# Patient Record
Sex: Female | Born: 1995 | Race: White | Hispanic: No | Marital: Single | Smoking: Former smoker
Health system: Southern US, Community
[De-identification: ages and names within clinical notes are randomized; demographics above are authoritative.]

## PROBLEM LIST (undated history)

## (undated) DIAGNOSIS — G43909 Migraine, unspecified, not intractable, without status migrainosus: Secondary | ICD-10-CM

## (undated) HISTORY — DX: Migraine, unspecified, not intractable, without status migrainosus: G43.909

---
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
## 2019-03-05 ENCOUNTER — Other Ambulatory Visit: Payer: Self-pay

## 2019-03-05 ENCOUNTER — Emergency Department (HOSPITAL_COMMUNITY): Payer: Medicaid - Out of State

## 2019-03-05 ENCOUNTER — Emergency Department (HOSPITAL_COMMUNITY)
Admission: EM | Admit: 2019-03-05 | Discharge: 2019-03-05 | Disposition: A | Discharge status: Home / Self Care | Payer: Medicaid - Out of State | Attending: Emergency Medicine | Admitting: Emergency Medicine

## 2019-03-05 ENCOUNTER — Encounter (HOSPITAL_COMMUNITY): Payer: Self-pay

## 2019-03-05 DIAGNOSIS — F17200 Nicotine dependence, unspecified, uncomplicated: Secondary | ICD-10-CM | POA: Insufficient documentation

## 2019-03-05 DIAGNOSIS — Z79899 Other long term (current) drug therapy: Secondary | ICD-10-CM | POA: Diagnosis not present

## 2019-03-05 DIAGNOSIS — R112 Nausea with vomiting, unspecified: Secondary | ICD-10-CM | POA: Diagnosis present

## 2019-03-05 DIAGNOSIS — K529 Noninfective gastroenteritis and colitis, unspecified: Secondary | ICD-10-CM | POA: Diagnosis not present

## 2019-03-05 LAB — CBC WITH DIFFERENTIAL/PLATELET
Abs Immature Granulocytes: 0.06 10*3/uL (ref 0.00–0.07)
Basophils Absolute: 0 10*3/uL (ref 0.0–0.1)
Basophils Relative: 0 %
Eosinophils Absolute: 0 10*3/uL (ref 0.0–0.5)
Eosinophils Relative: 0 %
HCT: 40.7 % (ref 36.0–46.0)
Hemoglobin: 13.5 g/dL (ref 12.0–15.0)
Immature Granulocytes: 0 %
Lymphocytes Relative: 7 %
Lymphs Abs: 0.9 10*3/uL (ref 0.7–4.0)
MCH: 29.5 pg (ref 26.0–34.0)
MCHC: 33.2 g/dL (ref 30.0–36.0)
MCV: 88.9 fL (ref 80.0–100.0)
Monocytes Absolute: 1.1 10*3/uL — ABNORMAL HIGH (ref 0.1–1.0)
Monocytes Relative: 8 %
Neutro Abs: 11.5 10*3/uL — ABNORMAL HIGH (ref 1.7–7.7)
Neutrophils Relative %: 85 %
Platelets: 271 10*3/uL (ref 150–400)
RBC: 4.58 MIL/uL (ref 3.87–5.11)
RDW: 12.8 % (ref 11.5–15.5)
WBC: 13.6 10*3/uL — ABNORMAL HIGH (ref 4.0–10.5)
nRBC: 0 % (ref 0.0–0.2)

## 2019-03-05 LAB — COMPREHENSIVE METABOLIC PANEL
ALT: 12 U/L (ref 0–44)
AST: 18 U/L (ref 15–41)
Albumin: 3.9 g/dL (ref 3.5–5.0)
Alkaline Phosphatase: 51 U/L (ref 38–126)
Anion gap: 10 (ref 5–15)
BUN: 8 mg/dL (ref 6–20)
CO2: 22 mmol/L (ref 22–32)
Calcium: 8.4 mg/dL — ABNORMAL LOW (ref 8.9–10.3)
Chloride: 104 mmol/L (ref 98–111)
Creatinine, Ser: 0.7 mg/dL (ref 0.44–1.00)
GFR calc Af Amer: >60 mL/min (ref >60)
GFR calc non Af Amer: >60 mL/min (ref >60)
Glucose, Bld: 102 mg/dL — ABNORMAL HIGH (ref 70–99)
Potassium: 3.4 mmol/L — ABNORMAL LOW (ref 3.5–5.1)
Sodium: 136 mmol/L (ref 135–145)
Total Bilirubin: 0.6 mg/dL (ref 0.3–1.2)
Total Protein: 6.7 g/dL (ref 6.5–8.1)

## 2019-03-05 LAB — LIPASE, BLOOD: Lipase: 27 U/L (ref 11–51)

## 2019-03-05 LAB — C DIFFICILE QUICK SCREEN W PCR REFLEX
C Diff antigen: POSITIVE — AB
C Diff interpretation: DETECTED
C Diff toxin: POSITIVE — AB

## 2019-03-05 LAB — URINALYSIS, ROUTINE W REFLEX MICROSCOPIC
Bilirubin Urine: NEGATIVE
Glucose, UA: NEGATIVE mg/dL
Ketones, ur: NEGATIVE mg/dL
Leukocytes,Ua: NEGATIVE
Nitrite: NEGATIVE
Protein, ur: NEGATIVE mg/dL
Specific Gravity, Urine: 1.019 (ref 1.005–1.030)
pH: 5 (ref 5.0–8.0)

## 2019-03-05 LAB — PREGNANCY, URINE: Preg Test, Ur: NEGATIVE

## 2019-03-05 LAB — POC OCCULT BLOOD, ED: Fecal Occult Bld: NEGATIVE

## 2019-03-05 IMAGING — DX CHEST - 2 VIEW
2 series · 2 of 2 positions shown · non-contrast
Comparison: None.

CLINICAL DATA: Abdominal cramping and nausea.  Fever.

EXAM:
CHEST - 2 VIEW

[chest pa]
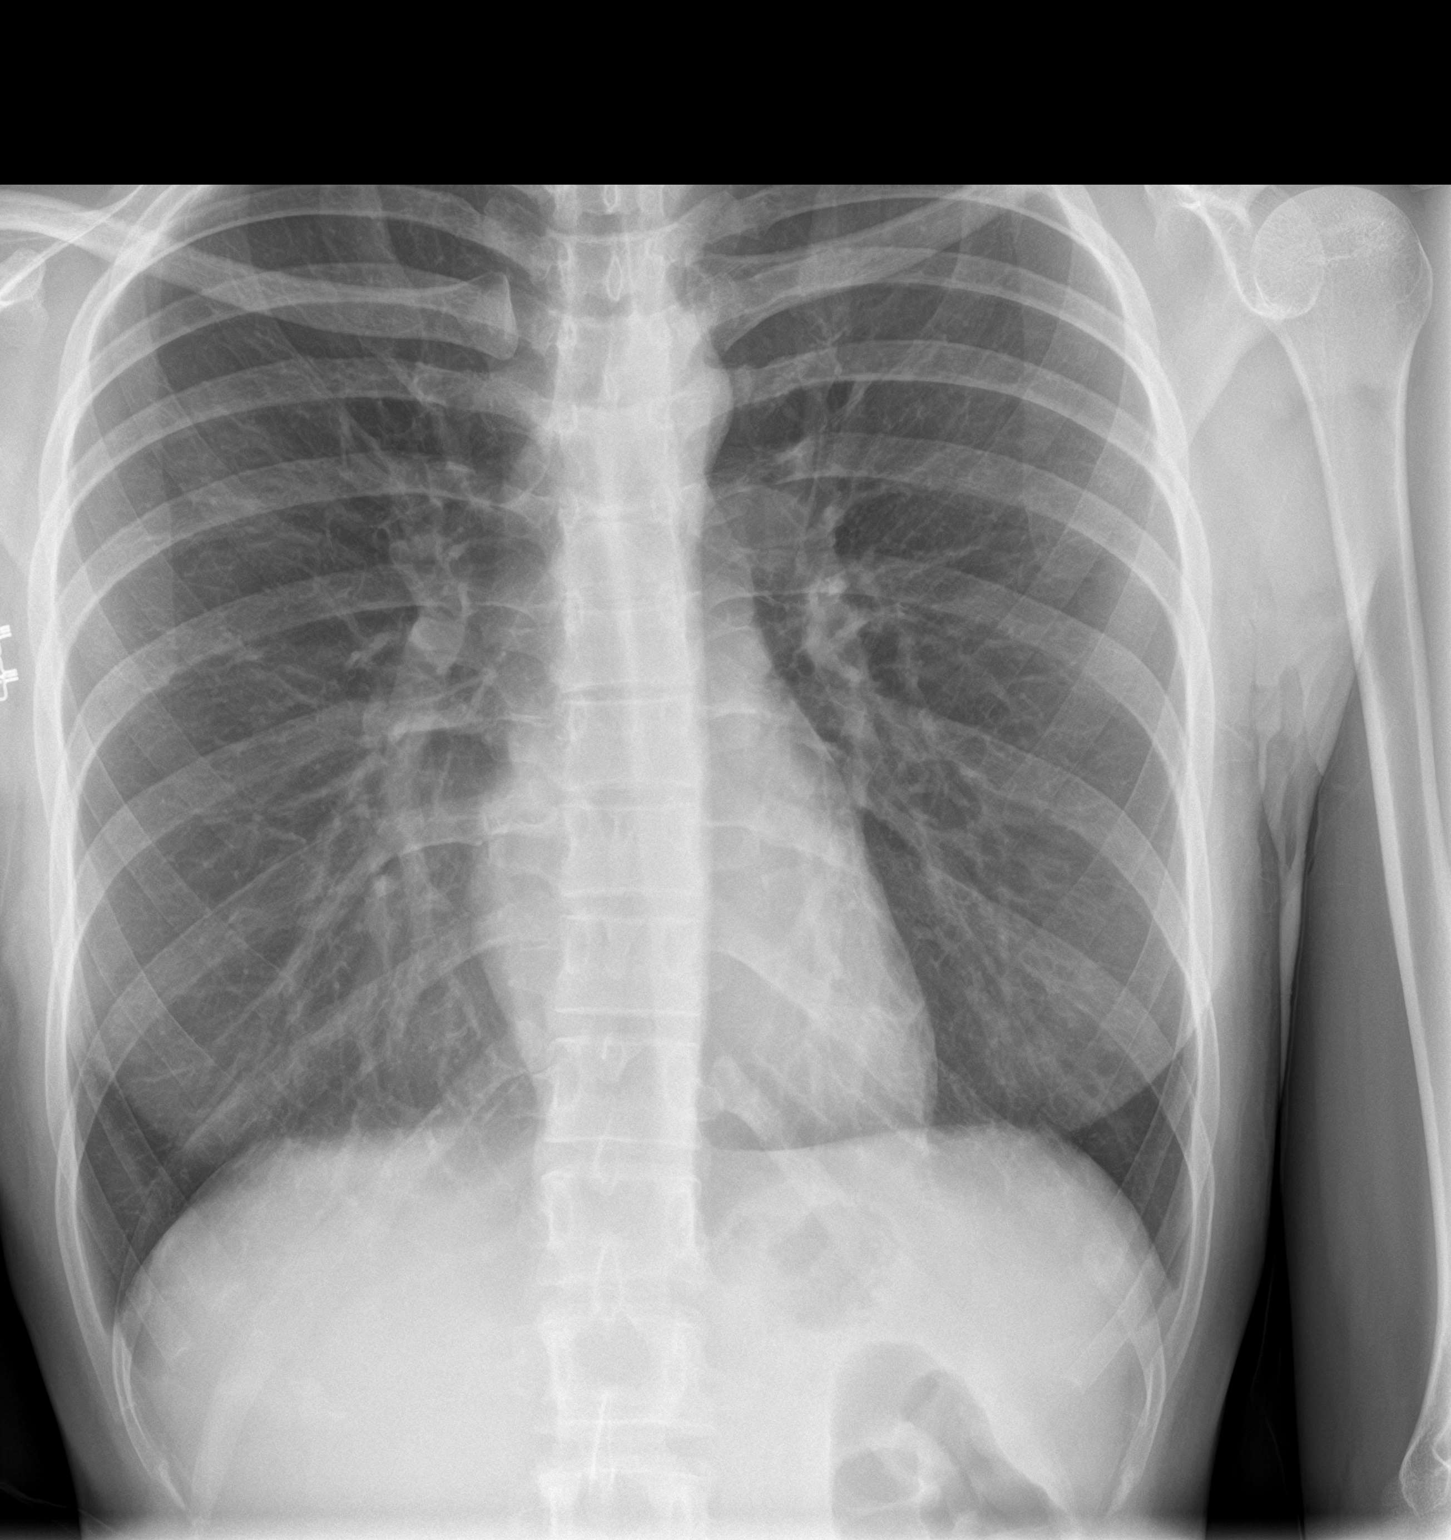

[chest lat]
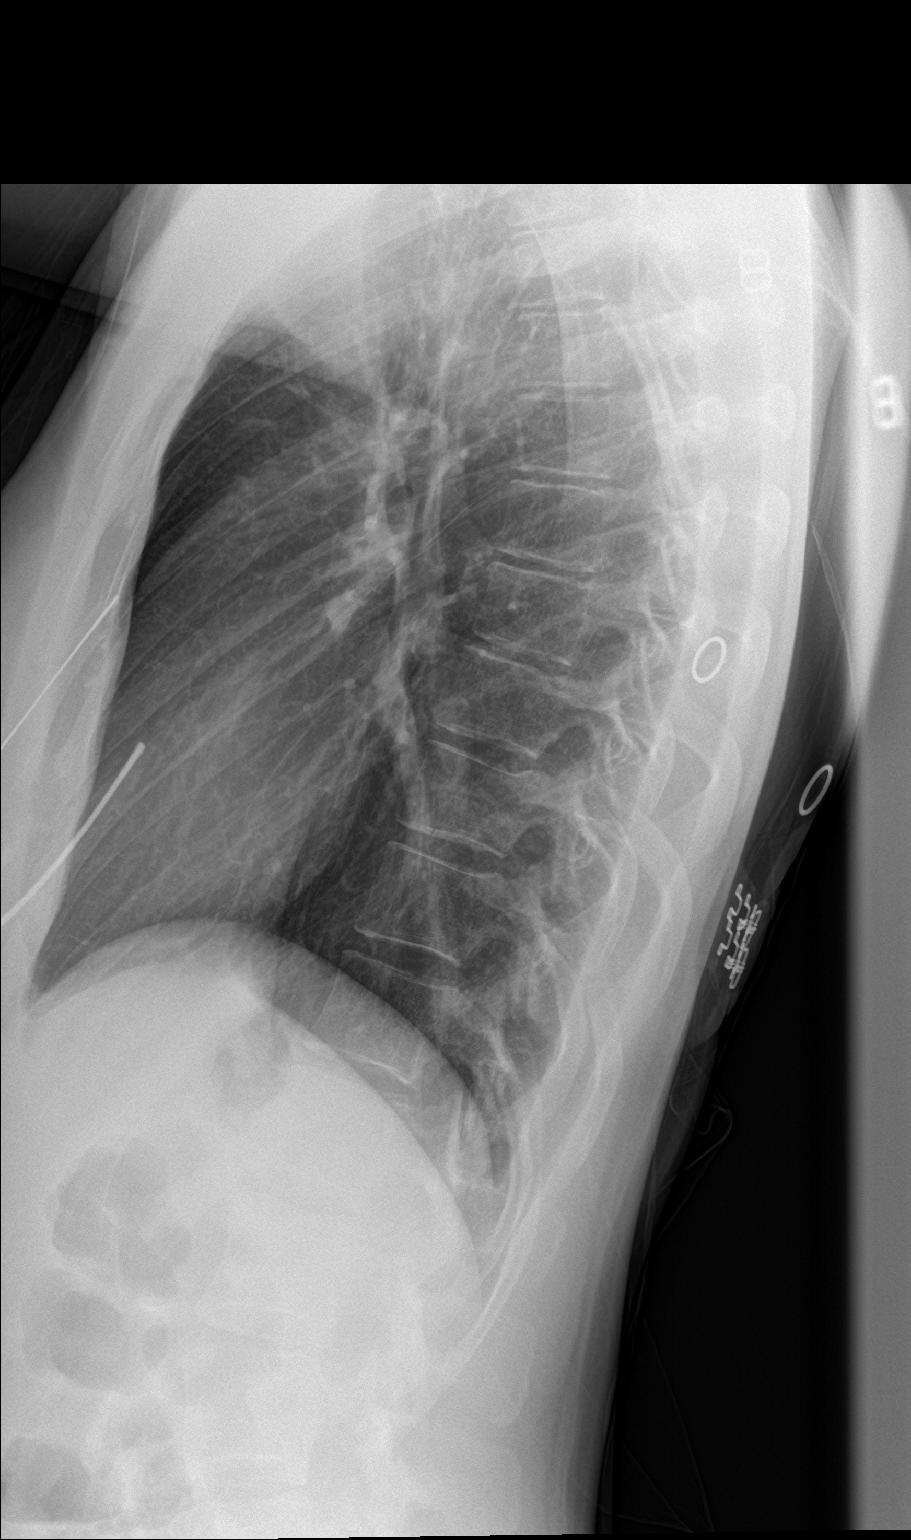

[2 of 2 positions shown; findings below may reference images not displayed]

FINDINGS: Lungs are clear. Heart size and pulmonary vascularity are normal. No
adenopathy. No bone lesions.
IMPRESSION: No edema or consolidation.

## 2019-03-05 IMAGING — CT CT ABDOMEN AND PELVIS WITH CONTRAST
2 of 4 series · 14 of 46 positions shown, 16 images · IV contrast (omnipaque)
Comparison: [DATE]

CLINICAL DATA: Abdominal pain with nausea, vomiting, and diarrhea

EXAM:
CT ABDOMEN AND PELVIS WITH CONTRAST
TECHNIQUE: Multidetector CT imaging of the abdomen and pelvis was performed
using the standard protocol following bolus administration of
intravenous contrast. Oral contrast was also administered.
CONTRAST:  30mL OMNIPAQUE IOHEXOL 300 MG/ML SOLN, 75mL OMNIPAQUE
IOHEXOL 300 MG/ML SOLN

[Series 2: axial st · axial · 0.59mm/px · z∈[+746,+1186]mm · 11 of 100 slices shown, 13 images]
[im 6/100  soft-tissue]
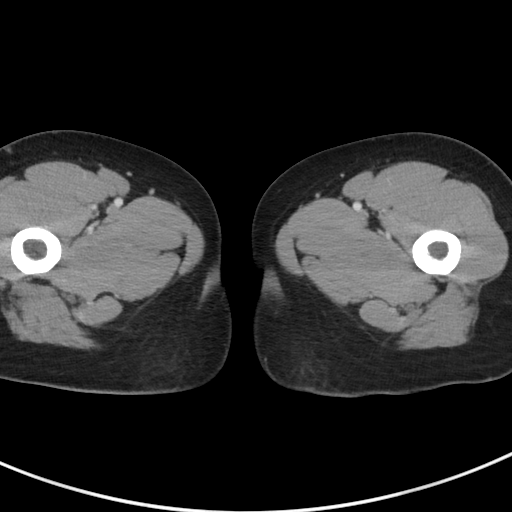
[im 6/100  bone]
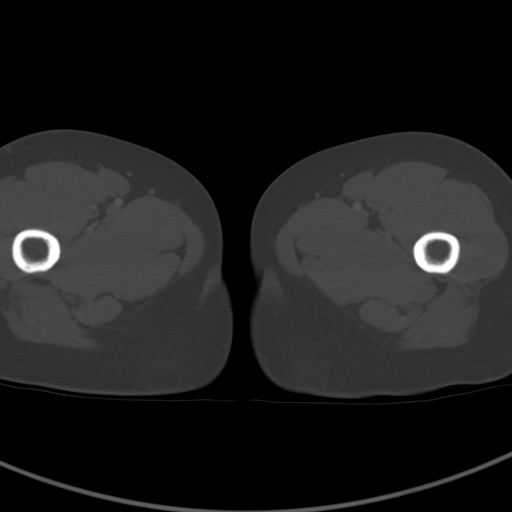
[im 17/100  soft-tissue]
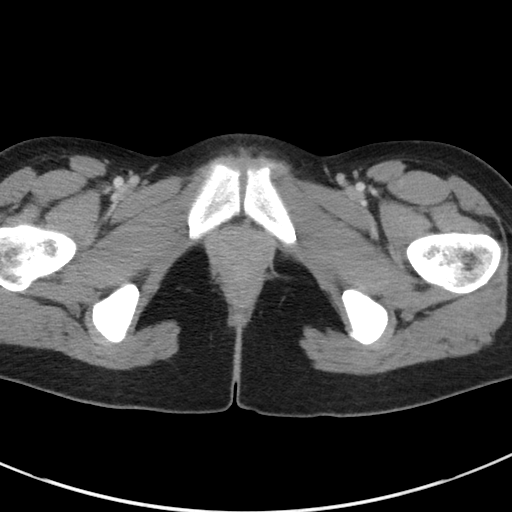
[im 23/100  soft-tissue]
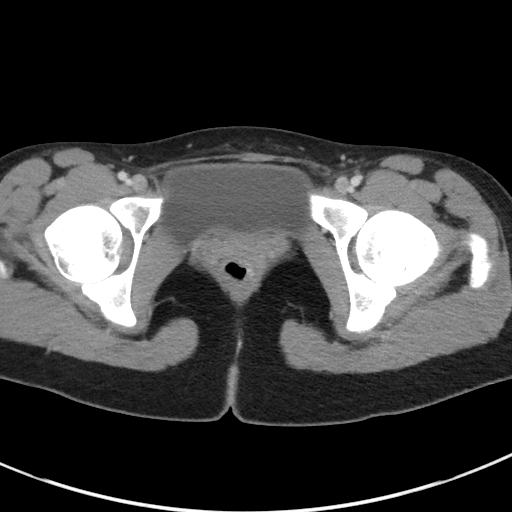
[im 34/100  soft-tissue]
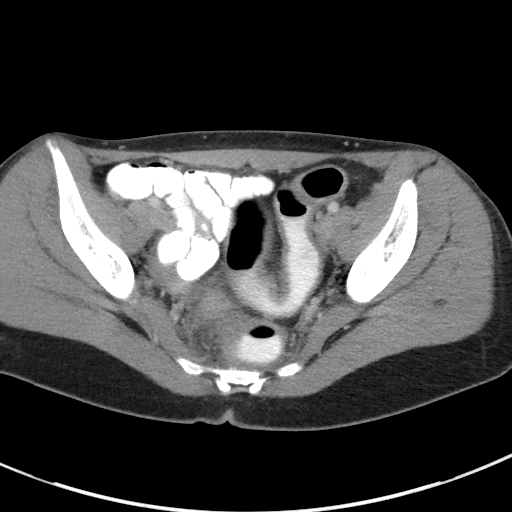
[im 39/100  soft-tissue]
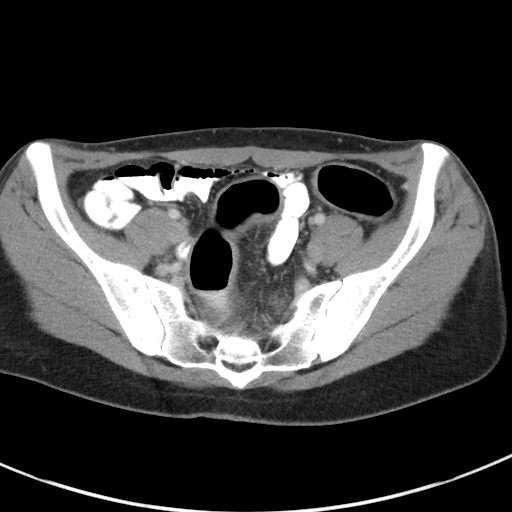
[im 50/100  soft-tissue]
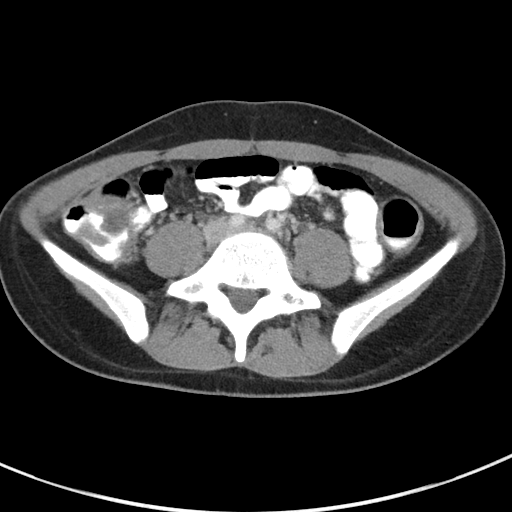
[im 61/100  soft-tissue]
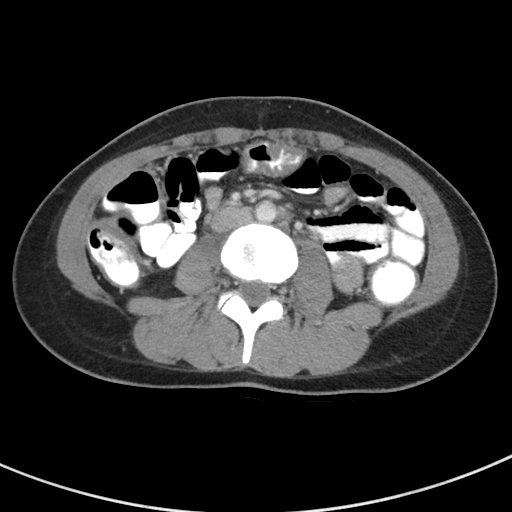
[im 67/100  soft-tissue]
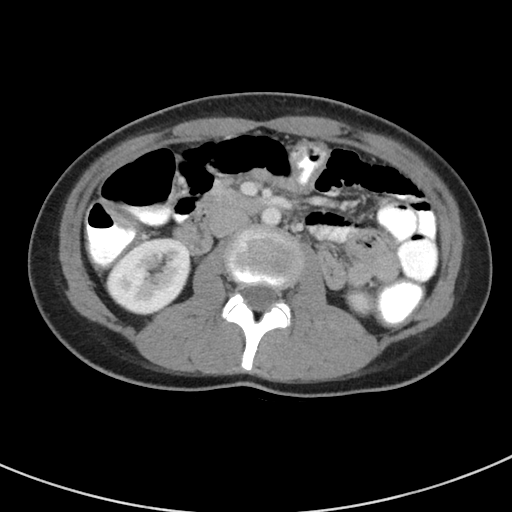
[im 78/100  soft-tissue]
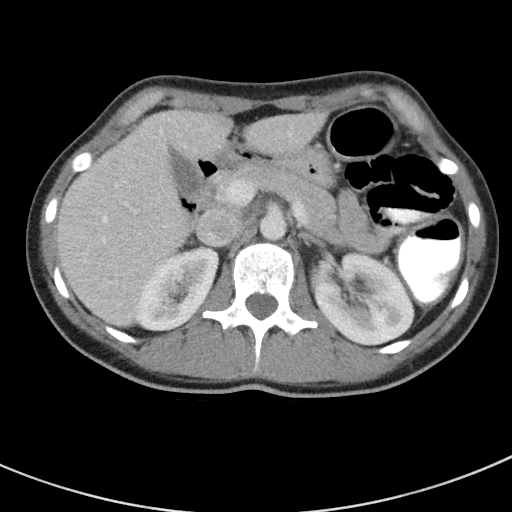
[im 78/100  bone]
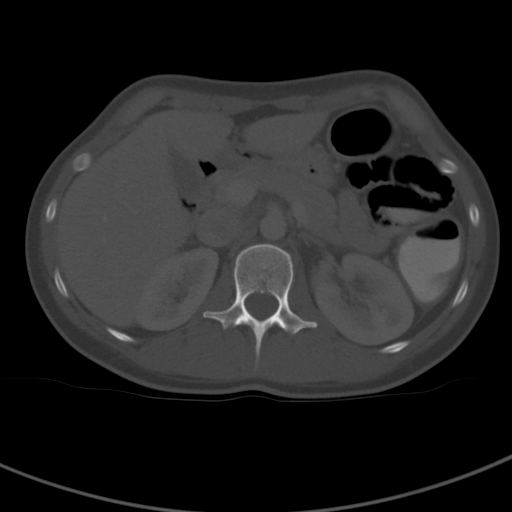
[im 83/100  soft-tissue]
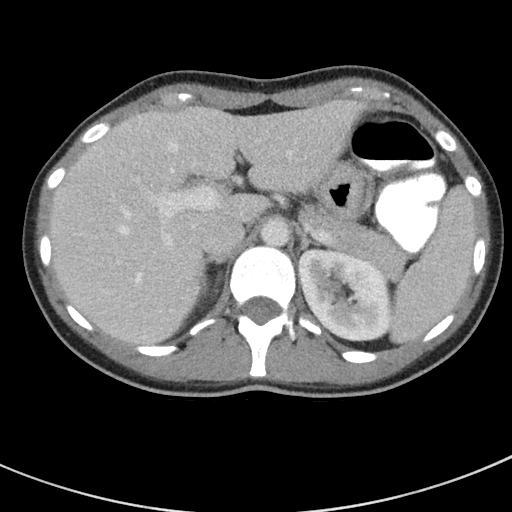
[im 94/100  soft-tissue]
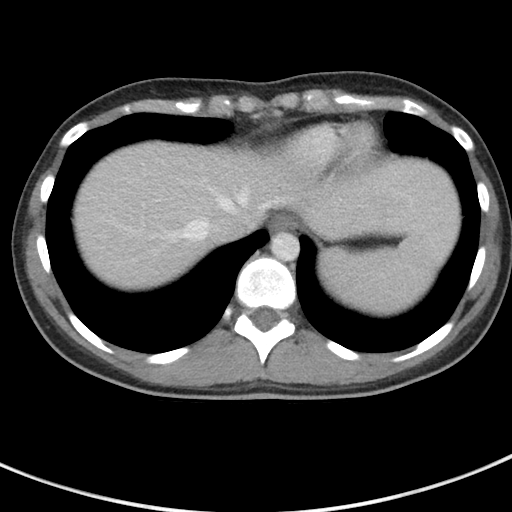

[Series 5: coronal st · coronal · 0.63mm/px · 3 of 61 slices shown]
[im 21/61  soft-tissue]
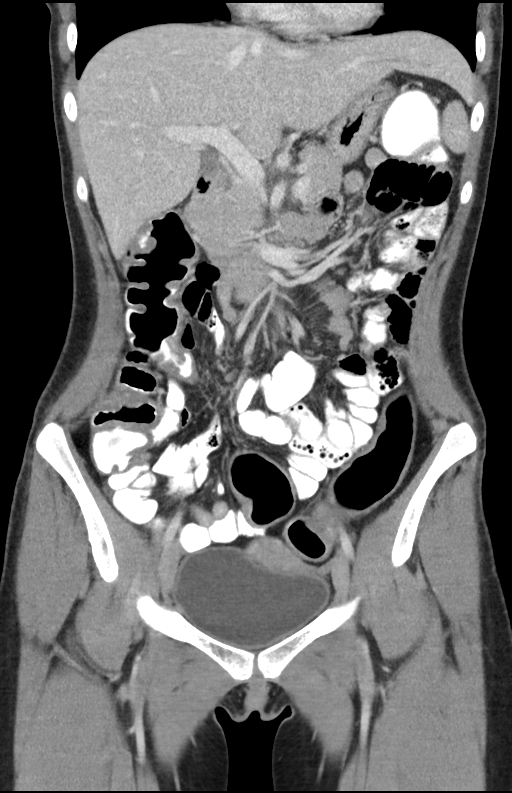
[im 27/61  soft-tissue]
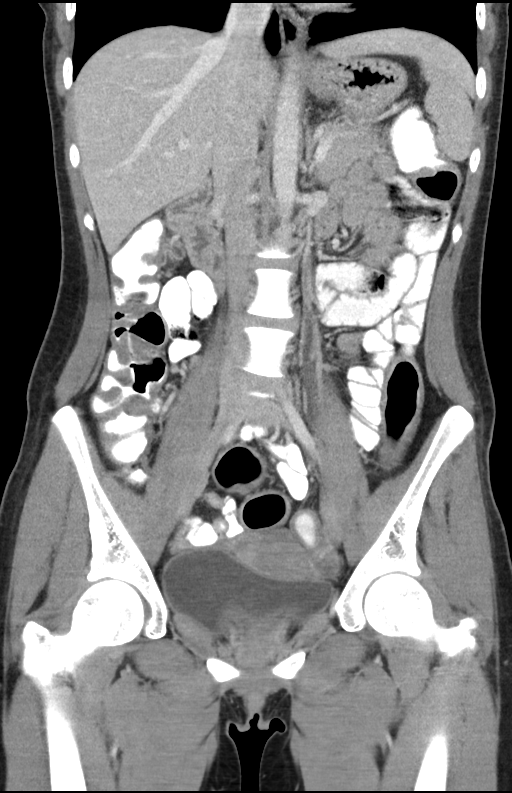
[im 34/61  soft-tissue]
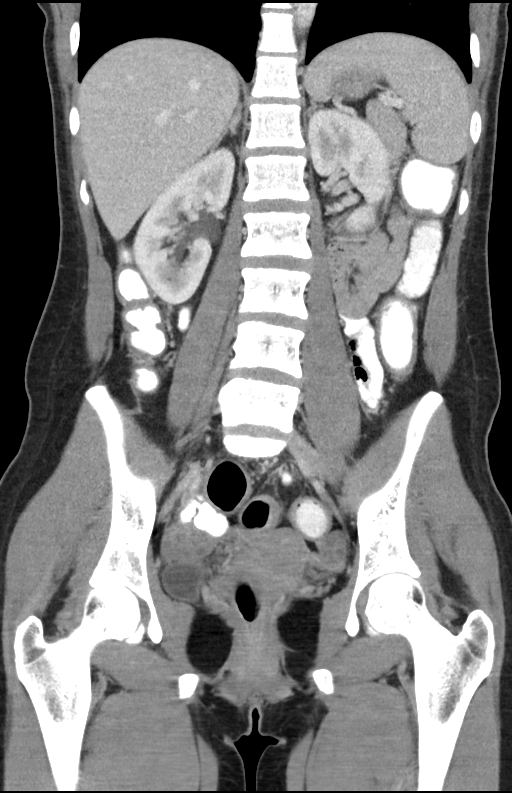

[14 of 46 positions shown; findings below may reference images not displayed]

FINDINGS: Lower chest: Lung bases are clear.

Hepatobiliary: No focal liver lesions are evident. Gallbladder wall
is not appreciably thickened. There is no biliary duct dilatation.

Pancreas: There is no evident pancreatic mass or inflammatory focus.

Spleen: No splenic lesions are evident.

Adrenals/Urinary Tract: Adrenals bilaterally appear unremarkable.
Kidneys bilaterally show no evident mass or hydronephrosis on either
side. There is no evident renal or ureteral calculus on either side.
Urinary bladder is midline with wall thickness within normal limits.

Stomach/Bowel: There is mild thickening of the wall of the distal
descending colon as well as most of the sigmoid colon and rectum.
There is loss of haustral folds in these areas. The surrounding
mesentery appears unremarkable. There are no diverticula in these
areas.

The colon elsewhere is largely collapsed which may account for an
appearance of relative wall thickening more proximally in the colon.
These areas do not appear appreciably inflamed. The terminal ileum
appears unremarkable. No evident bowel obstruction. No free air or
portal venous air. Note that small bowel appears unremarkable.

Vascular/Lymphatic: There is no abdominal aortic aneurysm. No
vascular lesions are evident. There is no adenopathy in the abdomen
or pelvis.

Reproductive: Uterus is anteverted.  No evident pelvic mass.

Other: Appendix appears normal. No abscess or ascites is evident in
the abdomen or pelvis.

Musculoskeletal: There are no blastic or lytic bone lesions. There
is no intramuscular or abdominal wall lesions.
IMPRESSION: 1. There is thickening of the walls of the rectum as well as most of
the sigmoid and mid the distal descending colon. There is apparent
loss of haustral folds in these areas. No fistulae evident. This
appearance raises concern for ulcerative colitis. Direct
visualization of the colon felt to be warranted in this regard.

2. Colon is largely decompressed elsewhere which may account for
relative wall thickening. Note that the terminal ileum and small
bowel appear normal. There is no appreciable diverticular disease.
No evident bowel obstruction.

3.  No abscess in the abdomen or pelvis.  Appendix appears normal.

4. No evident renal or ureteral calculus. No hydronephrosis. Urinary
bladder wall thickness within normal limits.

## 2019-03-05 MED ORDER — POTASSIUM CHLORIDE CRYS ER 20 MEQ PO TBCR: 20.0000 meq | EXTENDED_RELEASE_TABLET | Freq: Two times a day (BID) | ORAL | 0 refills | Status: DC

## 2019-03-05 MED ORDER — METRONIDAZOLE 500 MG PO TABS: 500.0000 mg | ORAL_TABLET | Freq: Three times a day (TID) | ORAL | 0 refills | Status: AC

## 2019-03-05 MED ORDER — LOPERAMIDE HCL 2 MG PO TABS: 2.0000 mg | ORAL_TABLET | Freq: Four times a day (QID) | ORAL | 0 refills | Status: DC | PRN

## 2019-03-05 MED ORDER — ONDANSETRON 4 MG PO TBDP: 4.0000 mg | ORAL_TABLET | Freq: Three times a day (TID) | ORAL | 1 refills | Status: DC | PRN

## 2019-03-05 MED ORDER — FAMOTIDINE IN NACL 20-0.9 MG/50ML-% IV SOLN
20.0000 mg | Freq: Once | INTRAVENOUS | Status: AC
  Administered 2019-03-05: 11:00:00 20 mg via INTRAVENOUS
  Filled 2019-03-05: qty 50

## 2019-03-05 MED ORDER — CIPROFLOXACIN HCL 500 MG PO TABS: 500.0000 mg | ORAL_TABLET | Freq: Two times a day (BID) | ORAL | 0 refills | Status: DC

## 2019-03-05 MED ORDER — IOHEXOL 300 MG/ML  SOLN
30.0000 mL | Freq: Once | INTRAMUSCULAR | Status: AC | PRN
  Administered 2019-03-05: 10:00:00 30 mL via ORAL

## 2019-03-05 MED ORDER — POTASSIUM CHLORIDE CRYS ER 20 MEQ PO TBCR
40.0000 meq | EXTENDED_RELEASE_TABLET | Freq: Once | ORAL | Status: AC
  Administered 2019-03-05: 40 meq via ORAL
  Filled 2019-03-05: qty 2

## 2019-03-05 MED ORDER — DICYCLOMINE HCL 10 MG/ML IM SOLN
20.0000 mg | Freq: Once | INTRAMUSCULAR | Status: AC
  Administered 2019-03-05: 20 mg via INTRAMUSCULAR
  Filled 2019-03-05: qty 2

## 2019-03-05 MED ORDER — IOHEXOL 300 MG/ML  SOLN
75.0000 mL | Freq: Once | INTRAMUSCULAR | Status: AC | PRN
  Administered 2019-03-05: 12:00:00 75 mL via INTRAVENOUS

## 2019-03-05 MED ORDER — ACETAMINOPHEN 325 MG PO TABS
650.0000 mg | ORAL_TABLET | Freq: Once | ORAL | Status: AC
Start: 2019-03-05 — End: 2019-03-05
  Administered 2019-03-05: 650 mg via ORAL
  Filled 2019-03-05: qty 2

## 2019-03-05 MED ORDER — SODIUM CHLORIDE 0.9 % IV BOLUS
1000.0000 mL | Freq: Once | INTRAVENOUS | Status: AC
  Administered 2019-03-05: 11:00:00 1000 mL via INTRAVENOUS

## 2019-03-05 MED ORDER — ONDANSETRON HCL 4 MG/2ML IJ SOLN
4.0000 mg | INTRAMUSCULAR | Status: DC | PRN
  Administered 2019-03-05: 11:00:00 4 mg via INTRAVENOUS
  Filled 2019-03-05: qty 2

## 2019-03-05 MED ORDER — METRONIDAZOLE 500 MG PO TABS
500.0000 mg | ORAL_TABLET | Freq: Once | ORAL | Status: AC
  Administered 2019-03-05: 500 mg via ORAL
  Filled 2019-03-05: qty 1

## 2019-03-05 MED ORDER — CIPROFLOXACIN HCL 250 MG PO TABS
500.0000 mg | ORAL_TABLET | Freq: Once | ORAL | Status: AC
  Administered 2019-03-05: 500 mg via ORAL
  Filled 2019-03-05: qty 2

## 2019-03-05 NOTE — ED Notes (Signed)
Patient transported to X-ray 

## 2019-03-05 NOTE — ED Notes (Signed)
Pt tolerating ginger ale at this time. No vomiting or diarrhea

## 2019-03-05 NOTE — ED Provider Notes (Signed)
[facility] EMERGENCY DEPARTMENT Provider Note   CSN: [id number] Arrival date & time: 03/05/19  0932    History   Chief Complaint Chief Complaint  Patient presents with   Abdominal Pain    HPI Sheila Lyons is a [age] y.o. female.     HPI  Pt was seen at 0950. Per pt, c/o gradual onset and persistence of multiple intermittent episodes of N/V/D for the past 3 days.   Describes the stools as "watery." Has been associated with generalized abd "pains." Denies fevers, cough, known COVID+ exposures. Denies hx of similar symptoms. Denies recent travel, antibiotics. Denies sick contacts. Denies CP/SOB, no back pain, no fevers, no black or blood in stools or emesis.    History reviewed. No pertinent past medical history.  There are no active problems to display for this patient.   History reviewed. No pertinent surgical history.   OB History   No obstetric history on file.      Home Medications    Prior to Admission medications   Medication Sig Start Date End Date Taking? Authorizing Provider  ondansetron (ZOFRAN) 4 MG tablet Take 1 tablet (4 mg total) by mouth every 6 (six) hours. 01/01/15   Janne NapoleonNeese, Hope M, NP  pantoprazole (PROTONIX) 40 MG tablet Take 40 mg by mouth daily.    [provider]  phenazopyridine (PYRIDIUM) 200 MG tablet Take 1 tablet (200 mg total) by mouth 3 (three) times daily. 01/01/15   Janne NapoleonNeese, Hope M, NP    Family History Family History  Problem Relation Age of Onset   Hypertension Father    Hypertension Other    Hyperlipidemia Other    Diabetes Other     Social History Social History   Tobacco Use   Smoking status: Current Every Day Smoker    Packs/day: 0.25   Smokeless tobacco: Never Used  Substance Use Topics   Alcohol use: No   Drug use: No     Allergies   Penicillins; Sulfa antibiotics; and Keflex [cephalexin]   Review of Systems Review of Systems ROS: Statement: All systems negative except as marked or noted in  the HPI; Constitutional: Negative for fever and chills. ; ; Eyes: Negative for eye pain, redness and discharge. ; ; ENMT: Negative for ear pain, hoarseness, nasal congestion, sinus pressure and sore throat. ; ; Cardiovascular: Negative for chest pain, palpitations, diaphoresis, dyspnea and peripheral edema. ; ; Respiratory: Negative for cough, wheezing and stridor. ; ; Gastrointestinal: +N/V/D, abd pain. Negative for blood in stool, hematemesis, jaundice and rectal bleeding. . ; ; Genitourinary: Negative for dysuria, flank pain and hematuria. ; ; Musculoskeletal: Negative for back pain and neck pain. Negative for swelling and trauma.; ; Skin: Negative for pruritus, rash, abrasions, blisters, bruising and skin lesion.; ; Neuro: Negative for headache, lightheadedness and neck stiffness. Negative for weakness, altered level of consciousness, altered mental status, extremity weakness, paresthesias, involuntary movement, seizure and syncope.       Physical Exam Updated Vital Signs BP 110/78 (BP Location: Left Arm)    Pulse (!) 120    Temp (!) 100.9 F (38.3 C) (Oral)    Resp (!) 22    SpO2 97%    Patient Vitals for the past 24 hrs:  BP Temp Temp src Pulse Resp SpO2  03/05/19 1310 98/68 98.3 F (36.8 C) Oral (!) 103 20 100 %  03/05/19 1000 93/63 -- -- (!) 106 -- 100 %  03/05/19 0943 110/78 (!) 100.9 F (38.3 C) Oral Marland Kitchen(!)  120 (!) 22 97 %     Physical Exam 0955: Physical examination:  Nursing notes reviewed; Vital signs and O2 SAT reviewed;  Constitutional: Well developed, Well nourished, Well hydrated, In no acute distress; Head:  Normocephalic, atraumatic; Eyes: EOMI, PERRL, No scleral icterus; ENMT: Mouth and pharynx normal, Mucous membranes moist; Neck: Supple, Full range of motion, No lymphadenopathy; Cardiovascular: Regular rate and rhythm, No gallop; Respiratory: Breath sounds clear & equal bilaterally, No wheezes.  Speaking full sentences with ease, Normal respiratory effort/excursion; Chest:  Nontender, Movement normal; Abdomen: Soft, +mild diffuse tenderness to palp. No rebound or guarding. Nondistended, Normal bowel sounds; Genitourinary: No CVA tenderness; Extremities: Peripheral pulses normal, No tenderness, No edema, No calf edema or asymmetry.; Neuro: AA&Ox3, Major CN grossly intact.  Speech clear. No gross focal motor or sensory deficits in extremities.; Skin: Color normal, Warm, Dry.   ED Treatments / Results  Labs (all labs ordered are listed, but only abnormal results are displayed)   EKG None  Radiology   Procedures Procedures (including critical care time)  Medications Ordered in ED Medications  sodium chloride 0.9 % bolus 1,000 mL (has no administration in time range)  ondansetron (ZOFRAN) injection 4 mg (has no administration in time range)  famotidine (PEPCID) IVPB 20 mg premix (has no administration in time range)  dicyclomine (BENTYL) injection 20 mg (has no administration in time range)     Initial Impression / Assessment and Plan / ED Course  I have reviewed the triage vital signs and the nursing notes.  Pertinent labs & imaging results that were available during my care of the patient were reviewed by me and considered in my medical decision making (see chart for details).     MDM Reviewed: previous chart, nursing note and vitals Reviewed previous: labs Interpretation: labs, x-ray and CT scan   Results for orders placed or performed during the hospital encounter of 03/05/19  Pregnancy, urine  Result Value Ref Range   Preg Test, Ur NEGATIVE NEGATIVE  Urinalysis, Routine w reflex microscopic  Result Value Ref Range   Color, Urine YELLOW YELLOW   APPearance HAZY (A) CLEAR   Specific Gravity, Urine 1.019 1.005 - 1.030   pH 5.0 5.0 - 8.0   Glucose, UA NEGATIVE NEGATIVE mg/dL   Hgb urine dipstick SMALL (A) NEGATIVE   Bilirubin Urine NEGATIVE NEGATIVE   Ketones, ur NEGATIVE NEGATIVE mg/dL   Protein, ur NEGATIVE NEGATIVE mg/dL   Nitrite  NEGATIVE NEGATIVE   Leukocytes,Ua NEGATIVE NEGATIVE   RBC / HPF 0-5 0 - 5 RBC/hpf   WBC, UA 0-5 0 - 5 WBC/hpf   Bacteria, UA RARE (A) NONE SEEN   Squamous Epithelial / LPF 0-5 0 - 5   Mucus PRESENT   Comprehensive metabolic panel  Result Value Ref Range   Sodium 136 135 - 145 mmol/L   Potassium 3.4 (L) 3.5 - 5.1 mmol/L   Chloride 104 98 - 111 mmol/L   CO2 22 22 - 32 mmol/L   Glucose, Bld 102 (H) 70 - 99 mg/dL   BUN 8 6 - 20 mg/dL   Creatinine, Ser 9.60 0.44 - 1.00 mg/dL   Calcium 8.4 (L) 8.9 - 10.3 mg/dL   Total Protein 6.7 6.5 - 8.1 g/dL   Albumin 3.9 3.5 - 5.0 g/dL   AST 18 15 - 41 U/L   ALT 12 0 - 44 U/L   Alkaline Phosphatase 51 38 - 126 U/L   Total Bilirubin 0.6 0.3 - 1.2 mg/dL  GFR calc non Af Amer >60 >60 mL/min   GFR calc Af Amer >60 >60 mL/min   Anion gap 10 5 - 15  Lipase, blood  Result Value Ref Range   Lipase 27 11 - 51 U/L  CBC with Differential  Result Value Ref Range   WBC 13.6 (H) 4.0 - 10.5 K/uL   RBC 4.58 3.87 - 5.11 MIL/uL   Hemoglobin 13.5 12.0 - 15.0 g/dL   HCT 16.1 09.6 - 04.5 %   MCV 88.9 80.0 - 100.0 fL   MCH 29.5 26.0 - 34.0 pg   MCHC 33.2 30.0 - 36.0 g/dL   RDW 40.9 81.1 - 91.4 %   Platelets 271 150 - 400 K/uL   nRBC 0.0 0.0 - 0.2 %   Neutrophils Relative % 85 %   Neutro Abs 11.5 (H) 1.7 - 7.7 K/uL   Lymphocytes Relative 7 %   Lymphs Abs 0.9 0.7 - 4.0 K/uL   Monocytes Relative 8 %   Monocytes Absolute 1.1 (H) 0.1 - 1.0 K/uL   Eosinophils Relative 0 %   Eosinophils Absolute 0.0 0.0 - 0.5 K/uL   Basophils Relative 0 %   Basophils Absolute 0.0 0.0 - 0.1 K/uL   Immature Granulocytes 0 %   Abs Immature Granulocytes 0.06 0.00 - 0.07 K/uL   Dg Chest 2 View Result Date: 03/05/2019 CLINICAL DATA:  Abdominal cramping and nausea.  Fever. EXAM: CHEST - 2 VIEW COMPARISON:  None. FINDINGS: Lungs are clear. Heart size and pulmonary vascularity are normal. No adenopathy. No bone lesions. IMPRESSION: No edema or consolidation. Electronically Signed    By: Bretta Bang III M.D.   On: 03/05/2019 11:41   Ct Abdomen Pelvis W Contrast Result Date: 03/05/2019 CLINICAL DATA:  Abdominal pain with nausea, vomiting, and diarrhea EXAM: CT ABDOMEN AND PELVIS WITH CONTRAST TECHNIQUE: Multidetector CT imaging of the abdomen and pelvis was performed using the standard protocol following bolus administration of intravenous contrast. Oral contrast was also administered. CONTRAST:  30mL OMNIPAQUE IOHEXOL 300 MG/ML SOLN, 75mL OMNIPAQUE IOHEXOL 300 MG/ML SOLN COMPARISON:  Feb 20, 2013 FINDINGS: Lower chest: Lung bases are clear. Hepatobiliary: No focal liver lesions are evident. Gallbladder wall is not appreciably thickened. There is no biliary duct dilatation. Pancreas: There is no evident pancreatic mass or inflammatory focus. Spleen: No splenic lesions are evident. Adrenals/Urinary Tract: Adrenals bilaterally appear unremarkable. Kidneys bilaterally show no evident mass or hydronephrosis on either side. There is no evident renal or ureteral calculus on either side. Urinary bladder is midline with wall thickness within normal limits. Stomach/Bowel: There is mild thickening of the wall of the distal descending colon as well as most of the sigmoid colon and rectum. There is loss of haustral folds in these areas. The surrounding mesentery appears unremarkable. There are no diverticula in these areas. The colon elsewhere is largely collapsed which may account for an appearance of relative wall thickening more proximally in the colon. These areas do not appear appreciably inflamed. The terminal ileum appears unremarkable. No evident bowel obstruction. No free air or portal venous air. Note that small bowel appears unremarkable. Vascular/Lymphatic: There is no abdominal aortic aneurysm. No vascular lesions are evident. There is no adenopathy in the abdomen or pelvis. Reproductive: Uterus is anteverted.  No evident pelvic mass. Other: Appendix appears normal. No abscess or ascites  is evident in the abdomen or pelvis. Musculoskeletal: There are no blastic or lytic bone lesions. There is no intramuscular or abdominal wall lesions. IMPRESSION: 1. There  is thickening of the walls of the rectum as well as most of the sigmoid and mid the distal descending colon. There is apparent loss of haustral folds in these areas. No fistulae evident. This appearance raises concern for ulcerative colitis. Direct visualization of the colon felt to be warranted in this regard. 2. Colon is largely decompressed elsewhere which may account for relative wall thickening. Note that the terminal ileum and small bowel appear normal. There is no appreciable diverticular disease. No evident bowel obstruction. 3.  No abscess in the abdomen or pelvis.  Appendix appears normal. 4. No evident renal or ureteral calculus. No hydronephrosis. Urinary bladder wall thickness within normal limits. Electronically Signed   By: Bretta Bang III M.D.   On: 03/05/2019 12:33     Sheila Lyons was evaluated in Emergency Department on 03/05/2019 for the symptoms described in the history of present illness. She was evaluated in the context of the global COVID-19 pandemic, which necessitated consideration that the patient might be at risk for infection with the SARS-CoV-2 virus that causes COVID-19. Institutional protocols and algorithms that pertain to the evaluation of patients at risk for COVID-19 are in a state of rapid change based on information released by regulatory bodies including the CDC and federal and state organizations. These policies and algorithms were followed during the patient's care in the ED.   1255:   APAP given for fever. HR improved after IVF. Pt has ambulated several times with steady gait easy resps, NAD.  BP per pt's baseline per Epic chart review. CT as above. PO cipro/flagyl given. Potassium repleted PO. Pt denies hx of colon disease.  No clear indication for admission at this time. T/C returned  from GI Dr. Karilyn Cota, case discussed, including:  HPI, pertinent PM/SHx, VS/PE, dx testing, ED course and treatment:  Agrees with ED tx, requests to try to obtain stool samples and pt can f/u in office. Dx and testing, as well as d/w GI MD, d/w pt.  Questions answered.  Verb understanding, agreeable with plan to d/c home with rx abx and f/u with GI as outpatient.  1500:  Awaiting stool sample, PO challenge. Sign out to Dr. Deretha Emory.      Final Clinical Impressions(s) / ED Diagnoses   Final diagnoses:  None    ED Discharge Orders    None       Samuel Jester, DO 03/08/19 1642

## 2019-03-05 NOTE — ED Provider Notes (Signed)
Stool cultures were obtained.  It was heme negative.  Patient with no further vomiting here.  Patient will be discharged home on Cipro and Flagyl Imodium Zofran ODT and a 2-day course of potassium.  We will called gastroenterology for follow-up will return for any new or worse symptoms.  Patient CT scan could be consistent with inflammatory bowel disease.   Vanetta Mulders, MD 03/05/19 1721

## 2019-03-05 NOTE — ED Notes (Signed)
Dr. Deretha Emory notified of pt's positive CDiff results by Georgette Shell RN

## 2019-03-05 NOTE — ED Notes (Signed)
Cup of water given to pt at bedside for po challenge.

## 2019-03-05 NOTE — Discharge Instructions (Signed)
Continue to take the Cipro and Flagyl as directed.  Take the Imodium to help slow down the diarrhea.  Take the Zofran dissolvable to help control the vomiting.  Work on Programmer, multimedia.  Would recommend clear liquids then a bland diet.  Call gastroenterology for follow-up.  They will sort out the cause of inflammation.  Your stool cultures have been sent and are pending.  Return for passage of large amounts of blood in the stool.  Return for persistent vomiting.  Return for any new or worse symptoms.

## 2019-03-05 NOTE — ED Triage Notes (Signed)
Pt c/o n/v/d and abd cramps x 3 days.   Unknown if has had fever.  Denies cough or sob

## 2019-03-06 LAB — GASTROINTESTINAL PANEL BY PCR, STOOL (REPLACES STOOL CULTURE)

## 2019-03-13 ENCOUNTER — Encounter (INDEPENDENT_AMBULATORY_CARE_PROVIDER_SITE_OTHER): Payer: Self-pay | Admitting: Internal Medicine

## 2019-03-13 ENCOUNTER — Ambulatory Visit (INDEPENDENT_AMBULATORY_CARE_PROVIDER_SITE_OTHER): Payer: Medicaid - Out of State | Admitting: Internal Medicine

## 2019-03-13 ENCOUNTER — Other Ambulatory Visit: Payer: Self-pay

## 2019-03-13 VITALS — BP 101/65 | HR 66 | Temp 98.5°F | Ht 66.0 in | Wt 119.3 lb

## 2019-03-13 DIAGNOSIS — A0472 Enterocolitis due to Clostridium difficile, not specified as recurrent: Secondary | ICD-10-CM | POA: Insufficient documentation

## 2019-03-13 MED ORDER — VANCOMYCIN HCL 125 MG PO CAPS: 125.0000 mg | ORAL_CAPSULE | Freq: Four times a day (QID) | ORAL | 0 refills | Status: DC

## 2019-03-13 NOTE — Progress Notes (Signed)
Subjective:    Patient ID: Sheila Lyons, female    DOB: 07-18-1996, 23 y.o.   MRN: [id number]  HPI Here today for f/u after a recent visit to the ED for colitis. Seen in the ED 03/05/2019. N,V,D x 3 days. Her stools were watery. She had generalized abdominal pain.  No fever,cough,. No past hx of similar symptoms. No recent travel. Had been on antibiotic 2 months prior to becoming sick for wisdom teeth extraction. No CP/SOB, no back pain. No black stools or BRRB.  I spoke with lab concerning the C-diff and it is a positive test Addison Bailey(Katey Yancey). She was covered x 7 days with Cipro and Flagyl x 7 days.  She tells me she is feeling better. She says her BMs are still not normal. Her stools are liquid. She is having 3-4 stools a day. She does feel better. Appetite is okay. She has lost 10 pounds since this started.  No family hx of inflammatory bowel disease.  CT scan was obtained which revealed;  IMPRESSION: 1. There is thickening of the walls of the rectum as well as most of the sigmoid and mid the distal descending colon. There is apparent loss of haustral folds in these areas. No fistulae evident. This appearance raises concern for ulcerative colitis. Direct visualization of the colon felt to be warranted in this regard.  2. Colon is largely decompressed elsewhere which may account for relative wall thickening. Note that the terminal ileum and small bowel appear normal. There is no appreciable diverticular disease. No evident bowel obstruction.  3.  No abscess in the abdomen or pelvis.  Appendix appears normal.  4. No evident renal or ureteral calculus. No hydronephrosis. Urinary bladder wall thickness within normal limits.  CBC    Component Value Date/Time   WBC 13.6 (H) 03/05/2019 1020   RBC 4.58 03/05/2019 1020   HGB 13.5 03/05/2019 1020   HCT 40.7 03/05/2019 1020   PLT 271 03/05/2019 1020   MCV 88.9 03/05/2019 1020   MCH 29.5 03/05/2019 1020   MCHC 33.2 03/05/2019 1020    RDW 12.8 03/05/2019 1020   LYMPHSABS 0.9 03/05/2019 1020   MONOABS 1.1 (H) 03/05/2019 1020   EOSABS 0.0 03/05/2019 1020   BASOSABS 0.0 03/05/2019 1020     C difficile quick scan w PCR reflex C diff antigen positive C diff toxin positive.  C diff interpretation  : toxin producing C difficile detected.   Review of Systems No past medical history on file.  No past surgical history on file.  Allergies  Allergen Reactions  . Penicillins   . Sulfa Antibiotics   . Keflex [Cephalexin] Hives and Rash    Current Outpatient Medications on File Prior to Visit  Medication Sig Dispense Refill  . loperamide (IMODIUM A-D) 2 MG tablet Take 1 tablet (2 mg total) by mouth 4 (four) times daily as needed for diarrhea or loose stools. 30 tablet 0  . ondansetron (ZOFRAN ODT) 4 MG disintegrating tablet Take 1 tablet (4 mg total) by mouth every 8 (eight) hours as needed. 10 tablet 1   No current facility-administered medications on file prior to visit.          Objective:   Physical Exam Blood pressure 101/65, pulse 66, temperature 98.5 F (36.9 C), height 5\' 6"  (1.676 m), weight 119 lb 4.8 oz (54.1 kg). Alert and oriented. Skin warm and dry. Oral mucosa is moist.   . Sclera anicteric, conjunctivae is pink. Thyroid not enlarged. No cervical  lymphadenopathy. Lungs clear. Heart regular rate and rhythm.  Abdomen is soft. Bowel sounds are positive. No hepatomegaly. No abdominal masses felt. No tenderness.  No edema to lower extremities.          Assessment & Plan:  C diff. Am going to cover her with Vancomycin 125mg  qid x 10 days. She will have OV in 3 weeks. Will draw labs when she comes back in for OV in 3 weeks.

## 2019-03-13 NOTE — Patient Instructions (Signed)
Rx for Vancomycin sent to her pharmacy 

## 2019-04-08 ENCOUNTER — Ambulatory Visit (INDEPENDENT_AMBULATORY_CARE_PROVIDER_SITE_OTHER): Payer: Medicaid - Out of State | Admitting: Internal Medicine

## 2019-04-08 ENCOUNTER — Encounter (INDEPENDENT_AMBULATORY_CARE_PROVIDER_SITE_OTHER): Payer: Self-pay | Admitting: Internal Medicine

## 2019-04-08 ENCOUNTER — Other Ambulatory Visit: Payer: Self-pay

## 2019-04-08 VITALS — BP 110/73 | HR 97 | Temp 98.3°F | Ht 66.0 in | Wt 120.3 lb

## 2019-04-08 DIAGNOSIS — A0472 Enterocolitis due to Clostridium difficile, not specified as recurrent: Secondary | ICD-10-CM | POA: Diagnosis not present

## 2019-04-08 NOTE — Patient Instructions (Signed)
OV as needed 

## 2019-04-08 NOTE — Progress Notes (Signed)
   Subjective:    Patient ID: Sheila Lyons, female    DOB: 10-12-1996, 23 y.o.   MRN: [id number]  HPI Here today for f/u. Last seen 03/13/2019. Had been seen in the ED for colitis.  She was positive for C. Diff.  I covered here with Vancomycin 125mg  QID x 10 days. Her stools had been watery. She had generalized abdominal pain. No fever, cough. No recent travel.  She had been on am antibiotic (Clindamycin)  2 months prior to becoming sick for wisdom teeth extraction.  CT scan was obtained which revealed;  IMPRESSION: 1. There is thickening of the walls of the rectum as well as most of the sigmoid and mid the distal descending colon. There is apparent loss of haustral folds in these areas. No fistulae evident. This appearance raises concern for ulcerative colitis. Direct visualization of the colon felt to be warranted in this regard. She tells me she is doing better. She says now she is constipated.  Two days after staring the Vancomycin, the diarrhea stopped. She is 100%. No abdominal pain .She is having a BM daily and she says her stools are hard.    Review of Systems History reviewed. No pertinent past medical history.  History reviewed. No pertinent surgical history.  Allergies  Allergen Reactions  . Penicillins   . Sulfa Antibiotics   . Keflex [Cephalexin] Hives and Rash    Current Outpatient Medications on File Prior to Visit  Medication Sig Dispense Refill  . loperamide (IMODIUM A-D) 2 MG tablet Take 1 tablet (2 mg total) by mouth 4 (four) times daily as needed for diarrhea or loose stools. (Patient not taking: Reported on 04/08/2019) 30 tablet 0  . ondansetron (ZOFRAN ODT) 4 MG disintegrating tablet Take 1 tablet (4 mg total) by mouth every 8 (eight) hours as needed. (Patient not taking: Reported on 04/08/2019) 10 tablet 1  . vancomycin (VANCOCIN) 125 MG capsule Take 1 capsule (125 mg total) by mouth 4 (four) times daily. (Patient not taking: Reported on 04/08/2019) 40 capsule  0   No current facility-administered medications on file prior to visit.         Objective:   Physical Exam  Blood pressure 110/73, pulse 97, temperature 98.3 F (36.8 C), height 5\' 6"  (1.676 m), weight 120 lb 4.8 oz (54.6 kg). Alert and oriented. Skin warm and dry. Oral mucosa is moist.   . Sclera anicteric, conjunctivae is pink. Thyroid not enlarged. No cervical lymphadenopathy. Lungs clear. Heart regular rate and rhythm.  Abdomen is soft. Bowel sounds are positive. No hepatomegaly. No abdominal masses felt. No tenderness.  No edema to lower extremities.           Assessment & Plan:  C diff antibiotic induced (Clindaymycin). She is doing well. She is leaning toward constipation and I advised to take a stool softener. OV as needed.

## 2021-11-07 ENCOUNTER — Emergency Department (HOSPITAL_COMMUNITY)
Admission: EM | Admit: 2021-11-07 | Discharge: 2021-11-07 | Disposition: A | Discharge status: Home / Self Care | Payer: Medicaid - Out of State | Attending: Emergency Medicine | Admitting: Emergency Medicine

## 2021-11-07 ENCOUNTER — Encounter (HOSPITAL_COMMUNITY): Payer: Self-pay

## 2021-11-07 ENCOUNTER — Other Ambulatory Visit: Payer: Self-pay

## 2021-11-07 DIAGNOSIS — Z20822 Contact with and (suspected) exposure to covid-19: Secondary | ICD-10-CM | POA: Insufficient documentation

## 2021-11-07 DIAGNOSIS — K529 Noninfective gastroenteritis and colitis, unspecified: Secondary | ICD-10-CM | POA: Insufficient documentation

## 2021-11-07 LAB — CBC WITH DIFFERENTIAL/PLATELET
Abs Immature Granulocytes: 0.02 10*3/uL (ref 0.00–0.07)
Basophils Absolute: 0 10*3/uL (ref 0.0–0.1)
Basophils Relative: 0 %
Eosinophils Absolute: 0 10*3/uL (ref 0.0–0.5)
Eosinophils Relative: 0 %
HCT: 38.8 % (ref 36.0–46.0)
Hemoglobin: 13.4 g/dL (ref 12.0–15.0)
Immature Granulocytes: 0 %
Lymphocytes Relative: 6 %
Lymphs Abs: 0.5 10*3/uL — ABNORMAL LOW (ref 0.7–4.0)
MCH: 31.2 pg (ref 26.0–34.0)
MCHC: 34.5 g/dL (ref 30.0–36.0)
MCV: 90.4 fL (ref 80.0–100.0)
Monocytes Absolute: 0.5 10*3/uL (ref 0.1–1.0)
Monocytes Relative: 6 %
Neutro Abs: 8 10*3/uL — ABNORMAL HIGH (ref 1.7–7.7)
Neutrophils Relative %: 88 %
Platelets: 256 10*3/uL (ref 150–400)
RBC: 4.29 MIL/uL (ref 3.87–5.11)
RDW: 13.8 % (ref 11.5–15.5)
WBC: 9.2 10*3/uL (ref 4.0–10.5)
nRBC: 0 % (ref 0.0–0.2)

## 2021-11-07 LAB — COMPREHENSIVE METABOLIC PANEL
ALT: 15 U/L (ref 0–44)
AST: 20 U/L (ref 15–41)
Albumin: 4.1 g/dL (ref 3.5–5.0)
Alkaline Phosphatase: 52 U/L (ref 38–126)
Anion gap: 9 (ref 5–15)
BUN: 9 mg/dL (ref 6–20)
CO2: 22 mmol/L (ref 22–32)
Calcium: 8.3 mg/dL — ABNORMAL LOW (ref 8.9–10.3)
Chloride: 102 mmol/L (ref 98–111)
Creatinine, Ser: 0.56 mg/dL (ref 0.44–1.00)
GFR, Estimated: >60 mL/min (ref >60)
Glucose, Bld: 105 mg/dL — ABNORMAL HIGH (ref 70–99)
Potassium: 3.5 mmol/L (ref 3.5–5.1)
Sodium: 133 mmol/L — ABNORMAL LOW (ref 135–145)
Total Bilirubin: 0.9 mg/dL (ref 0.3–1.2)
Total Protein: 6.7 g/dL (ref 6.5–8.1)

## 2021-11-07 LAB — URINALYSIS, ROUTINE W REFLEX MICROSCOPIC
Bilirubin Urine: NEGATIVE
Glucose, UA: NEGATIVE mg/dL
Hgb urine dipstick: NEGATIVE
Ketones, ur: 80 mg/dL — AB
Leukocytes,Ua: NEGATIVE
Nitrite: POSITIVE — AB
Protein, ur: 100 mg/dL — AB
Specific Gravity, Urine: 1.03 (ref 1.005–1.030)
pH: 5 (ref 5.0–8.0)

## 2021-11-07 LAB — RESP PANEL BY RT-PCR (FLU A&B, COVID) ARPGX2
Influenza A by PCR: NEGATIVE
Influenza B by PCR: NEGATIVE
SARS Coronavirus 2 by RT PCR: NEGATIVE

## 2021-11-07 LAB — HCG, QUANTITATIVE, PREGNANCY: hCG, Beta Chain, Quant, S: <1 m[IU]/mL (ref <5)

## 2021-11-07 MED ORDER — ONDANSETRON 4 MG PO TBDP: ORAL_TABLET | ORAL | 1 refills | Status: DC

## 2021-11-07 MED ORDER — KETOROLAC TROMETHAMINE 30 MG/ML IJ SOLN
30.0000 mg | Freq: Once | INTRAMUSCULAR | Status: AC
  Administered 2021-11-07: 30 mg via INTRAVENOUS
  Filled 2021-11-07: qty 1

## 2021-11-07 MED ORDER — SODIUM CHLORIDE 0.9 % IV BOLUS
2000.0000 mL | Freq: Once | INTRAVENOUS | Status: AC
  Administered 2021-11-07: 2000 mL via INTRAVENOUS

## 2021-11-07 MED ORDER — DICYCLOMINE HCL 20 MG PO TABS: ORAL_TABLET | ORAL | 0 refills | Status: DC

## 2021-11-07 MED ORDER — LOPERAMIDE HCL 2 MG PO CAPS
4.0000 mg | ORAL_CAPSULE | Freq: Once | ORAL | Status: AC
  Administered 2021-11-07: 4 mg via ORAL
  Filled 2021-11-07: qty 2

## 2021-11-07 MED ORDER — ONDANSETRON HCL 4 MG/2ML IJ SOLN
4.0000 mg | Freq: Once | INTRAMUSCULAR | Status: AC
  Administered 2021-11-07: 4 mg via INTRAVENOUS
  Filled 2021-11-07: qty 2

## 2021-11-07 NOTE — ED Provider Notes (Signed)
[facility] EMERGENCY DEPARTMENT Provider Note   CSN: [id number] Arrival date & time: 11/07/21  [id number]     History  Chief Complaint  Patient presents with   Abdominal Pain   Nausea    dia   Diarrhea    Sheila Lyons is a 26 y.o. female.  Patient complains of vomiting and diarrhea.  For 1 to 2 days.  No blood in her diarrhea.  Patient felt like she may have thrown up some blood.  She has been having profuse diarrhea.  Patient has no past medical history  The history is provided by the patient and medical records. No language interpreter was used.  Abdominal Pain Pain location:  Epigastric Pain quality: aching   Pain radiates to:  Does not radiate Pain severity:  Mild Onset quality:  Sudden Timing:  Constant Progression:  Waxing and waning Chronicity:  New Context: not alcohol use   Relieved by:  Nothing Worsened by:  Nothing Associated symptoms: diarrhea   Associated symptoms: no chest pain, no cough, no fatigue and no hematuria   Diarrhea Associated symptoms: abdominal pain   Associated symptoms: no headaches       Home Medications Prior to Admission medications   Medication Sig Start Date End Date Taking? Authorizing Provider  dicyclomine (BENTYL) 20 MG tablet Take 1 pill for every 8-12 hours as needed for abdominal cramping 11/07/21  Yes Milton Ferguson, MD  ondansetron (ZOFRAN-ODT) 4 MG disintegrating tablet 4mg  ODT q4 hours prn nausea/vomit 11/07/21  Yes Milton Ferguson, MD  loperamide (IMODIUM A-D) 2 MG tablet Take 1 tablet (2 mg total) by mouth 4 (four) times daily as needed for diarrhea or loose stools. Patient not taking: Reported on 04/08/2019 03/05/19   Fredia Sorrow, MD  vancomycin (VANCOCIN) 125 MG capsule Take 1 capsule (125 mg total) by mouth 4 (four) times daily. Patient not taking: Reported on 04/08/2019 03/13/19   Butch Penny, NP      Allergies    Penicillins, Sulfa antibiotics, and Keflex [cephalexin]    Review of Systems   Review of  Systems  Constitutional:  Negative for appetite change and fatigue.  HENT:  Negative for congestion, ear discharge and sinus pressure.   Eyes:  Negative for discharge.  Respiratory:  Negative for cough.   Cardiovascular:  Negative for chest pain.  Gastrointestinal:  Positive for abdominal pain and diarrhea.  Genitourinary:  Negative for frequency and hematuria.  Musculoskeletal:  Negative for back pain.  Skin:  Negative for rash.  Neurological:  Negative for seizures and headaches.  Psychiatric/Behavioral:  Negative for hallucinations.    Physical Exam Updated Vital Signs BP (!) 86/60    Pulse 74    Temp 98.7 F (37.1 C) (Oral)    Resp 17    Ht 5\' 6"  (1.676 m)    Wt 53.1 kg    SpO2 97%    BMI 18.88 kg/m  Physical Exam Vitals and nursing note reviewed.  Constitutional:      Appearance: She is well-developed.  HENT:     Head: Normocephalic.  Eyes:     General: No scleral icterus.    Conjunctiva/sclera: Conjunctivae normal.  Neck:     Thyroid: No thyromegaly.  Cardiovascular:     Rate and Rhythm: Normal rate and regular rhythm.     Heart sounds: No murmur heard.   No friction rub. No gallop.  Pulmonary:     Breath sounds: No stridor. No wheezing or rales.  Chest:  Chest wall: No tenderness.  Abdominal:     General: There is no distension.     Tenderness: There is no abdominal tenderness. There is no rebound.  Musculoskeletal:        General: Normal range of motion.     Cervical back: Neck supple.  Lymphadenopathy:     Cervical: No cervical adenopathy.  Skin:    Findings: No erythema or rash.  Neurological:     Mental Status: She is alert and oriented to person, place, and time.     Motor: No abnormal muscle tone.     Coordination: Coordination normal.  Psychiatric:        Behavior: Behavior normal.    ED Results / Procedures / Treatments   Labs (all labs ordered are listed, but only abnormal results are displayed) Labs Reviewed  CBC WITH  DIFFERENTIAL/PLATELET - Abnormal; Notable for the following components:      Result Value   Neutro Abs 8.0 (*)    Lymphs Abs 0.5 (*)    All other components within normal limits  COMPREHENSIVE METABOLIC PANEL - Abnormal; Notable for the following components:   Sodium 133 (*)    Glucose, Bld 105 (*)    Calcium 8.3 (*)    All other components within normal limits  URINALYSIS, ROUTINE W REFLEX MICROSCOPIC - Abnormal; Notable for the following components:   Color, Urine AMBER (*)    APPearance HAZY (*)    Ketones, ur 80 (*)    Protein, ur 100 (*)    Nitrite POSITIVE (*)    Bacteria, UA RARE (*)    Non Squamous Epithelial 6-10 (*)    All other components within normal limits  RESP PANEL BY RT-PCR (FLU A&B, COVID) ARPGX2  URINE CULTURE  HCG, QUANTITATIVE, PREGNANCY    EKG None  Radiology No results found.  Procedures Procedures    Medications Ordered in ED Medications  sodium chloride 0.9 % bolus 2,000 mL (0 mLs Intravenous Stopped 11/07/21 1127)  ondansetron (ZOFRAN) injection 4 mg (4 mg Intravenous Given 11/07/21 0928)  ketorolac (TORADOL) 30 MG/ML injection 30 mg (30 mg Intravenous Given 11/07/21 0931)  loperamide (IMODIUM) capsule 4 mg (4 mg Oral Given 11/07/21 1039)    ED Course/ Medical Decision Making/ A&P                           Medical Decision Making Amount and/or Complexity of Data Reviewed Labs: ordered.  Risk Prescription drug management.   Patient with gastroenteritis and dehydration.  She will be sent home with Zofran and Bentyl and take occasional Imodium at home.  She also had a urine culture done.  She will follow-up if not improving   This patient presents to the ED for concern of vomiting and diarrhea, this involves an extensive number of treatment options, and is a complaint that carries with it a high risk of complications and morbidity.  The differential diagnosis includes gastroenteritis, colitis, sepsis   Co morbidities that complicate the  patient evaluation  None   Additional history obtained:  Additional history obtained from patient and mother External records from outside source obtained and reviewed including hospital record   Lab Tests:  I Ordered, and personally interpreted labs.  The pertinent results include: CBC and chemistry which showed mild decrease in sodium   Imaging Studies ordered:  No x-rays  Cardiac Monitoring:  The patient was maintained on a cardiac monitor.  I personally viewed and interpreted  the cardiac monitored which showed an underlying rhythm of: Normal sinus rhythm   Medicines ordered and prescription drug management:  I ordered medication including normal saline for dehydration, Zofran.  For nausea and Toradol for pain Reevaluation of the patient after these medicines showed that the patient improved I have reviewed the patients home medicines and have made adjustments as needed   Test Considered:  CT abdomen   Critical Interventions:  IV fluids and nausea medicine and pain medicine   Consultations Obtained: No consult  Problem List / ED Course:  Vomiting and diarrhea, gastroenteritis   Reevaluation:  After the interventions noted above, I reevaluated the patient and found that they have :improved   Social Determinants of Health:  None   Dispostion:  After consideration of the diagnostic results and the patients response to treatment, I feel that the patent would benefit from discharge home with p.o. fluids and nausea medicine and Bentyl for pain and follow-up with PCP.          Final Clinical Impression(s) / ED Diagnoses Final diagnoses:  Gastroenteritis    Rx / DC Orders ED Discharge Orders          Ordered    ondansetron (ZOFRAN-ODT) 4 MG disintegrating tablet        11/07/21 1159    dicyclomine (BENTYL) 20 MG tablet        11/07/21 1200              Milton Ferguson, MD 11/09/21 1027

## 2021-11-07 NOTE — ED Triage Notes (Signed)
Patient state burning with urination.

## 2021-11-07 NOTE — Discharge Instructions (Signed)
Drink plenty of fluids.  Take Imodium for diarrhea.  He can take Tylenol for aches and follow-up in the next couple days if not improving

## 2021-11-07 NOTE — ED Notes (Signed)
Report given to [place], RN

## 2021-11-07 NOTE — ED Triage Notes (Signed)
Patient states N/v/d since yesterday and states abdominal pain. Denies recent illness. Patient states covid right after christmas.

## 2021-11-09 LAB — URINE CULTURE: Culture: NO GROWTH

## 2021-11-10 ENCOUNTER — Emergency Department (HOSPITAL_COMMUNITY)
Admission: EM | Admit: 2021-11-10 | Discharge: 2021-11-10 | Disposition: A | Discharge status: Home / Self Care | Payer: Self-pay | Attending: Emergency Medicine | Admitting: Emergency Medicine

## 2021-11-10 ENCOUNTER — Emergency Department (HOSPITAL_COMMUNITY): Payer: Self-pay

## 2021-11-10 ENCOUNTER — Encounter (HOSPITAL_COMMUNITY): Payer: Self-pay | Admitting: *Deleted

## 2021-11-10 DIAGNOSIS — E876 Hypokalemia: Secondary | ICD-10-CM | POA: Insufficient documentation

## 2021-11-10 DIAGNOSIS — R1084 Generalized abdominal pain: Secondary | ICD-10-CM | POA: Insufficient documentation

## 2021-11-10 DIAGNOSIS — M25562 Pain in left knee: Secondary | ICD-10-CM | POA: Insufficient documentation

## 2021-11-10 DIAGNOSIS — R112 Nausea with vomiting, unspecified: Secondary | ICD-10-CM | POA: Insufficient documentation

## 2021-11-10 DIAGNOSIS — N9489 Other specified conditions associated with female genital organs and menstrual cycle: Secondary | ICD-10-CM | POA: Insufficient documentation

## 2021-11-10 DIAGNOSIS — Z20822 Contact with and (suspected) exposure to covid-19: Secondary | ICD-10-CM | POA: Insufficient documentation

## 2021-11-10 DIAGNOSIS — M79672 Pain in left foot: Secondary | ICD-10-CM | POA: Insufficient documentation

## 2021-11-10 DIAGNOSIS — R197 Diarrhea, unspecified: Secondary | ICD-10-CM

## 2021-11-10 LAB — CBC WITH DIFFERENTIAL/PLATELET
Abs Immature Granulocytes: 0.02 10*3/uL (ref 0.00–0.07)
Basophils Absolute: 0 10*3/uL (ref 0.0–0.1)
Basophils Relative: 0 %
Eosinophils Absolute: 0.3 10*3/uL (ref 0.0–0.5)
Eosinophils Relative: 4 %
HCT: 38.8 % (ref 36.0–46.0)
Hemoglobin: 13.1 g/dL (ref 12.0–15.0)
Immature Granulocytes: 0 %
Lymphocytes Relative: 29 %
Lymphs Abs: 2.2 10*3/uL (ref 0.7–4.0)
MCH: 30.9 pg (ref 26.0–34.0)
MCHC: 33.8 g/dL (ref 30.0–36.0)
MCV: 91.5 fL (ref 80.0–100.0)
Monocytes Absolute: 0.7 10*3/uL (ref 0.1–1.0)
Monocytes Relative: 9 %
Neutro Abs: 4.4 10*3/uL (ref 1.7–7.7)
Neutrophils Relative %: 58 %
Platelets: 261 10*3/uL (ref 150–400)
RBC: 4.24 MIL/uL (ref 3.87–5.11)
RDW: 13.7 % (ref 11.5–15.5)
WBC: 7.7 10*3/uL (ref 4.0–10.5)
nRBC: 0 % (ref 0.0–0.2)

## 2021-11-10 LAB — I-STAT BETA HCG BLOOD, ED (MC, WL, AP ONLY): I-stat hCG, quantitative: <5 m[IU]/mL (ref <5)

## 2021-11-10 LAB — COMPREHENSIVE METABOLIC PANEL WITH GFR
ALT: 21 U/L (ref 0–44)
AST: 26 U/L (ref 15–41)
Albumin: 4.1 g/dL (ref 3.5–5.0)
Alkaline Phosphatase: 50 U/L (ref 38–126)
Anion gap: 4 — ABNORMAL LOW (ref 5–15)
BUN: 6 mg/dL (ref 6–20)
CO2: 26 mmol/L (ref 22–32)
Calcium: 8.3 mg/dL — ABNORMAL LOW (ref 8.9–10.3)
Chloride: 109 mmol/L (ref 98–111)
Creatinine, Ser: 0.55 mg/dL (ref 0.44–1.00)
GFR, Estimated: >60 mL/min
Glucose, Bld: 81 mg/dL (ref 70–99)
Potassium: 3 mmol/L — ABNORMAL LOW (ref 3.5–5.1)
Sodium: 139 mmol/L (ref 135–145)
Total Bilirubin: 0.3 mg/dL (ref 0.3–1.2)
Total Protein: 7.1 g/dL (ref 6.5–8.1)

## 2021-11-10 LAB — URINALYSIS, ROUTINE W REFLEX MICROSCOPIC
Bilirubin Urine: NEGATIVE
Glucose, UA: NEGATIVE mg/dL
Ketones, ur: NEGATIVE mg/dL
Leukocytes,Ua: NEGATIVE
Nitrite: NEGATIVE
Protein, ur: NEGATIVE mg/dL
Specific Gravity, Urine: 1.015 (ref 1.005–1.030)
pH: 5 (ref 5.0–8.0)

## 2021-11-10 LAB — RESP PANEL BY RT-PCR (FLU A&B, COVID) ARPGX2
Influenza A by PCR: NEGATIVE
Influenza B by PCR: NEGATIVE
SARS Coronavirus 2 by RT PCR: NEGATIVE

## 2021-11-10 LAB — LIPASE, BLOOD: Lipase: 34 U/L (ref 11–51)

## 2021-11-10 IMAGING — DX DG FOOT COMPLETE 3+V*L*
3 series · 3 of 3 positions shown · non-contrast
Comparison: None.

CLINICAL DATA: Left foot pain following a fall last night.

EXAM:
LEFT FOOT - COMPLETE 3+ VIEW

[foot ap]
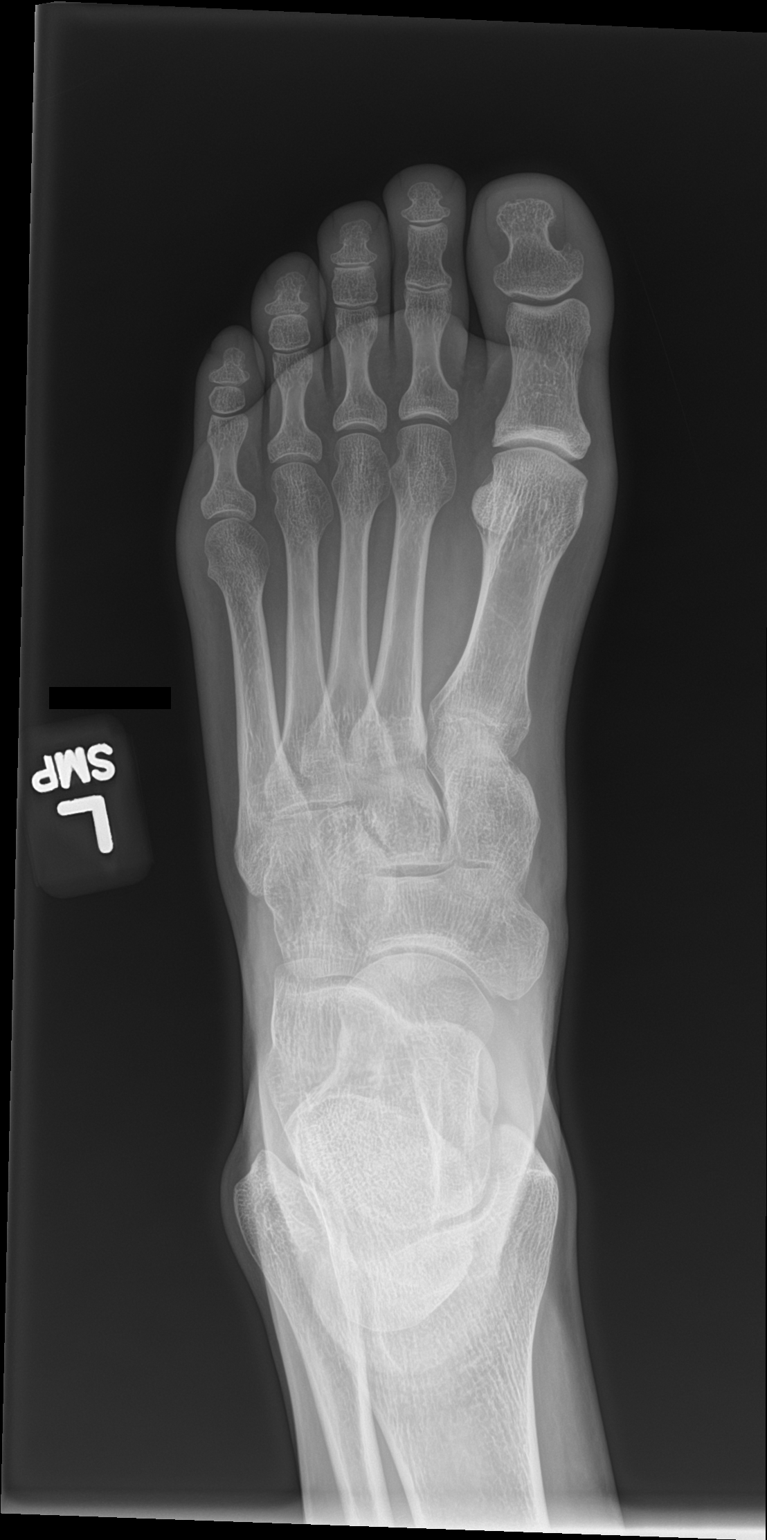

[foot obl]
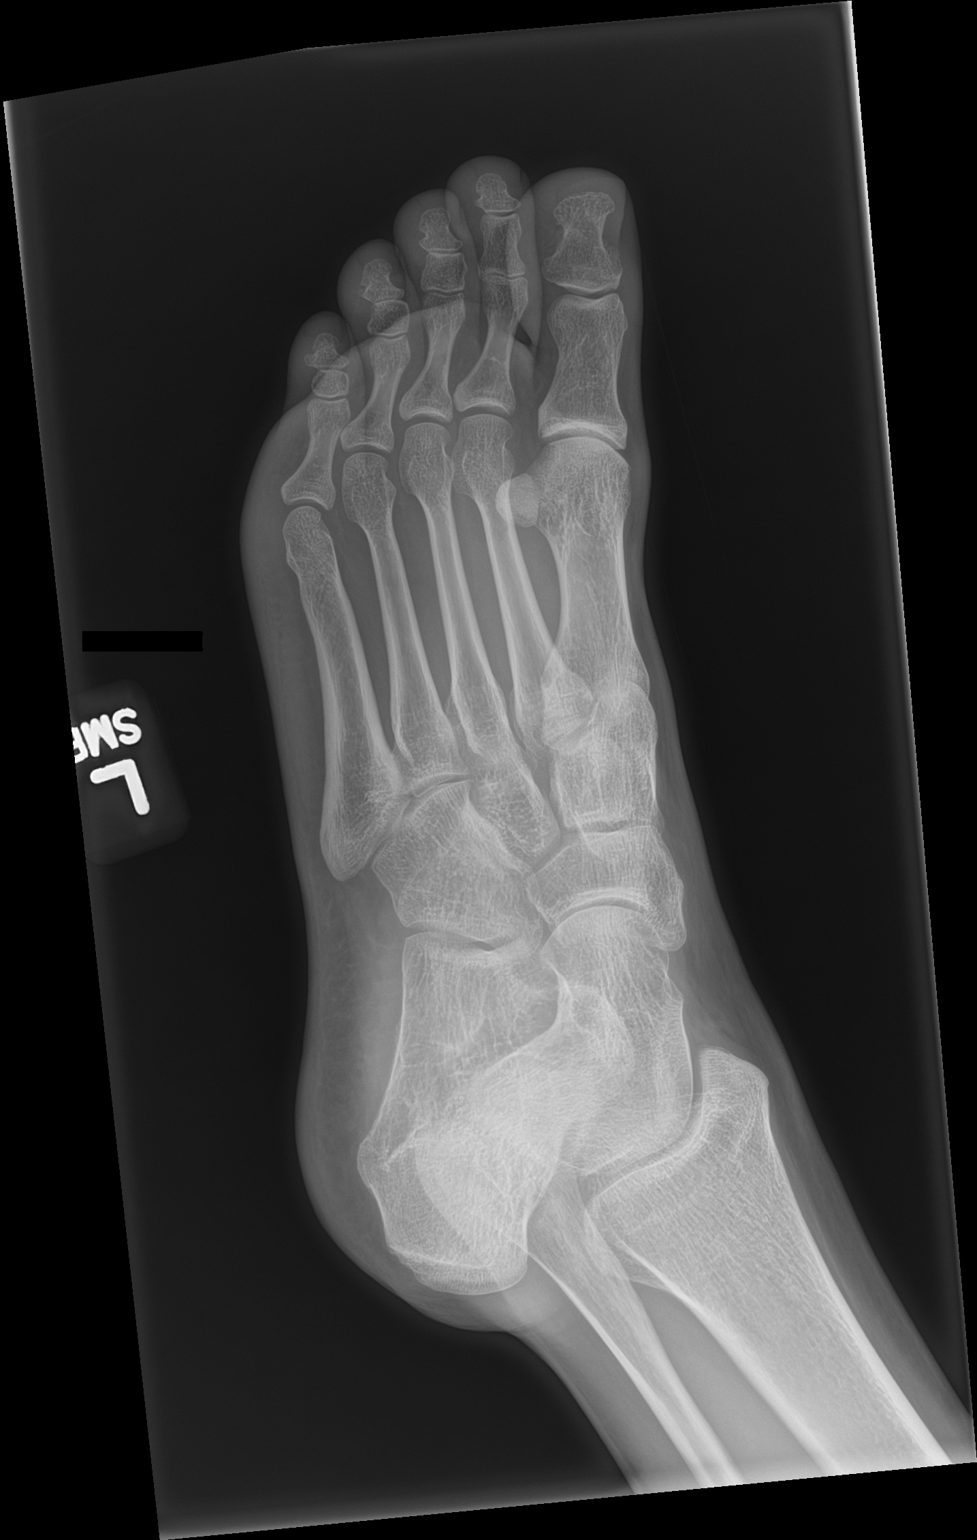

[foot lat]
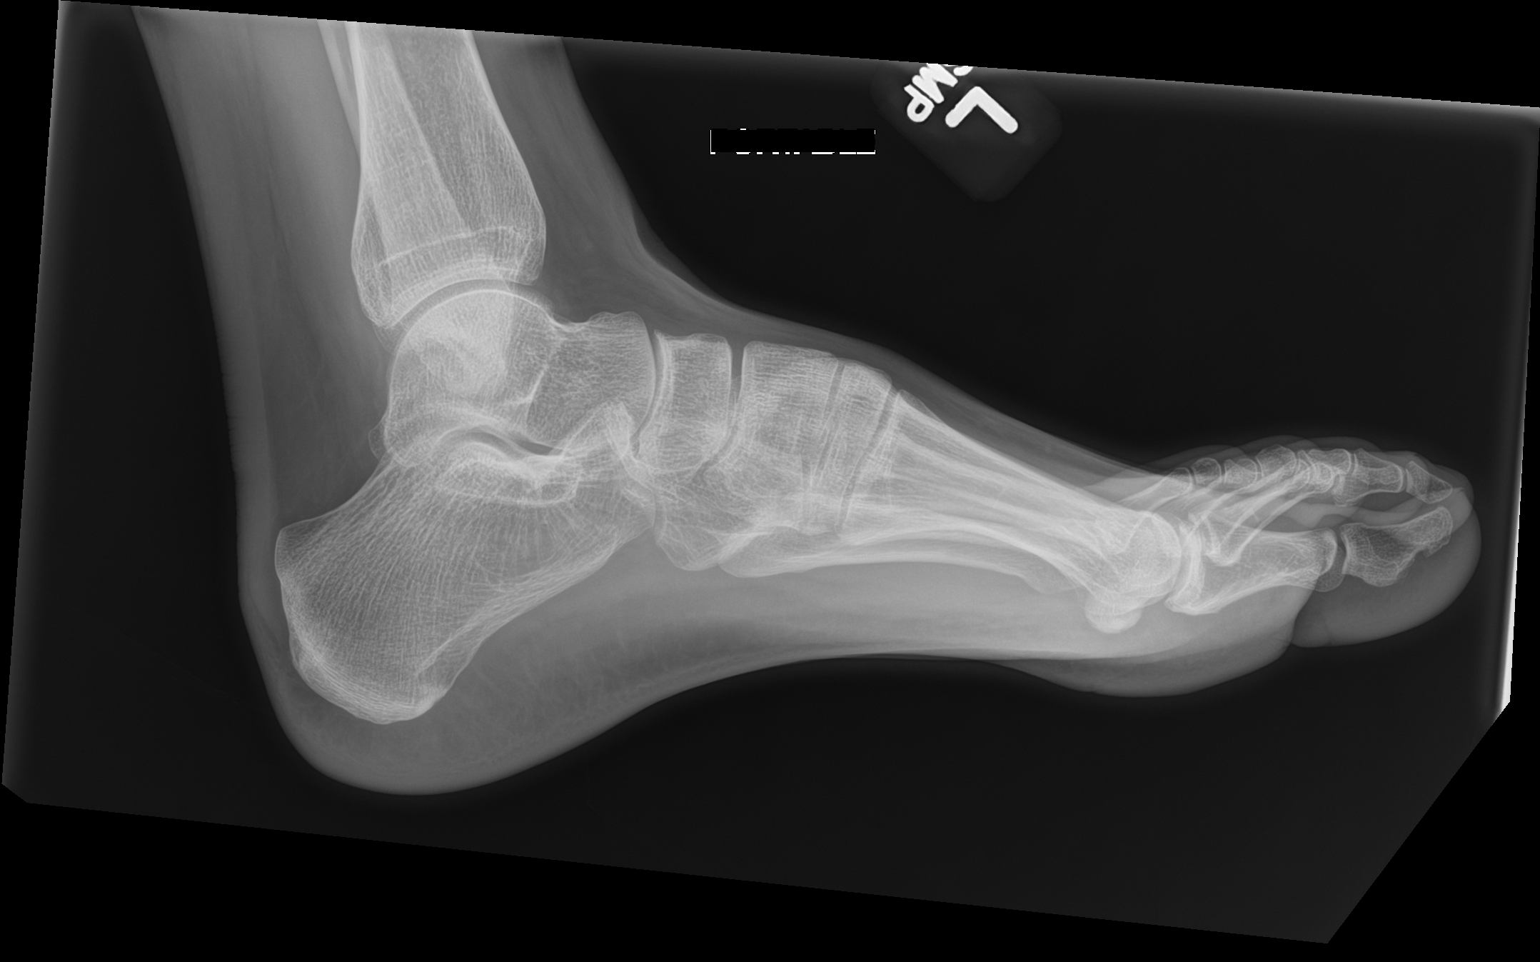

[3 of 3 positions shown; findings below may reference images not displayed]

FINDINGS: There is no evidence of fracture or dislocation. There is no
evidence of arthropathy or other focal bone abnormality. Soft
tissues are unremarkable.
IMPRESSION: Normal examination.

## 2021-11-10 IMAGING — DX DG KNEE COMPLETE 4+V*L*
4 series · 4 of 4 positions shown · non-contrast
Comparison: None.

CLINICAL DATA: Left foot pain, sometimes radiating into the left
knee, since a fall last night.

EXAM:
LEFT KNEE - COMPLETE 4+ VIEW

[knee ap]
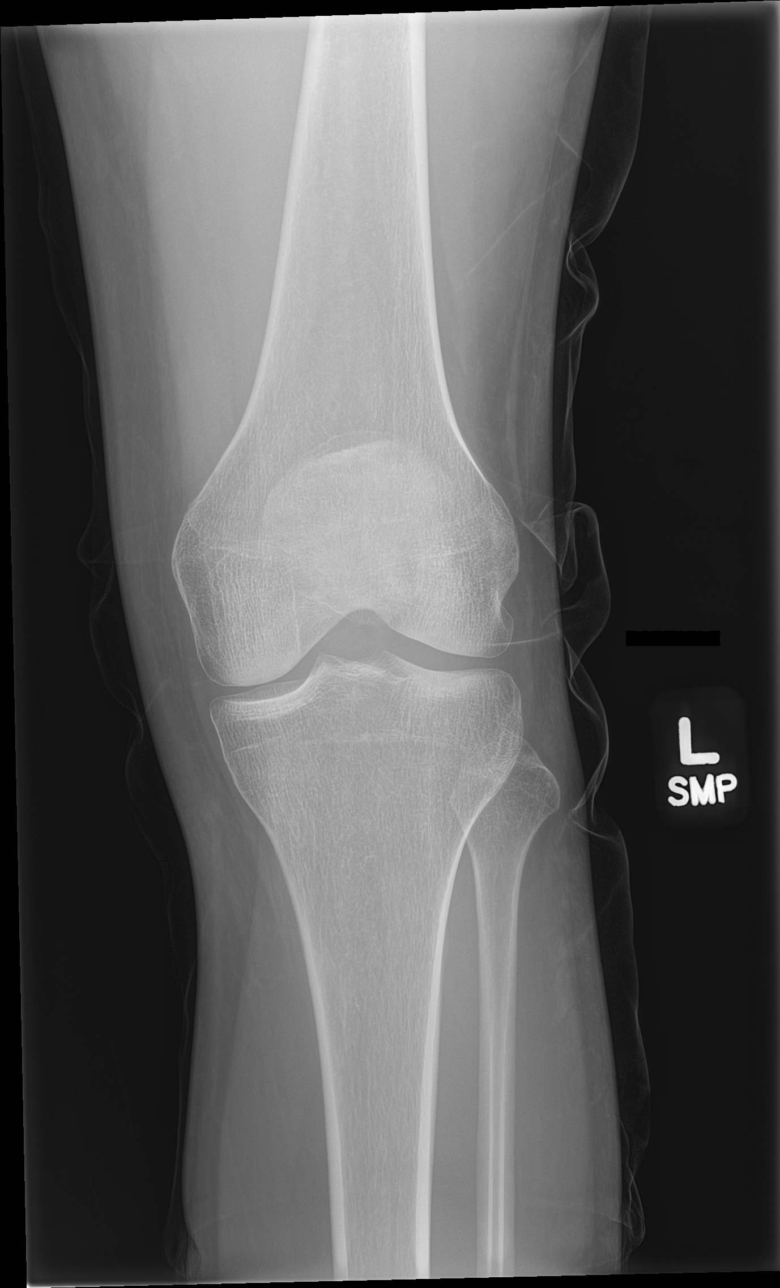

[knee obl (1 of 2)]
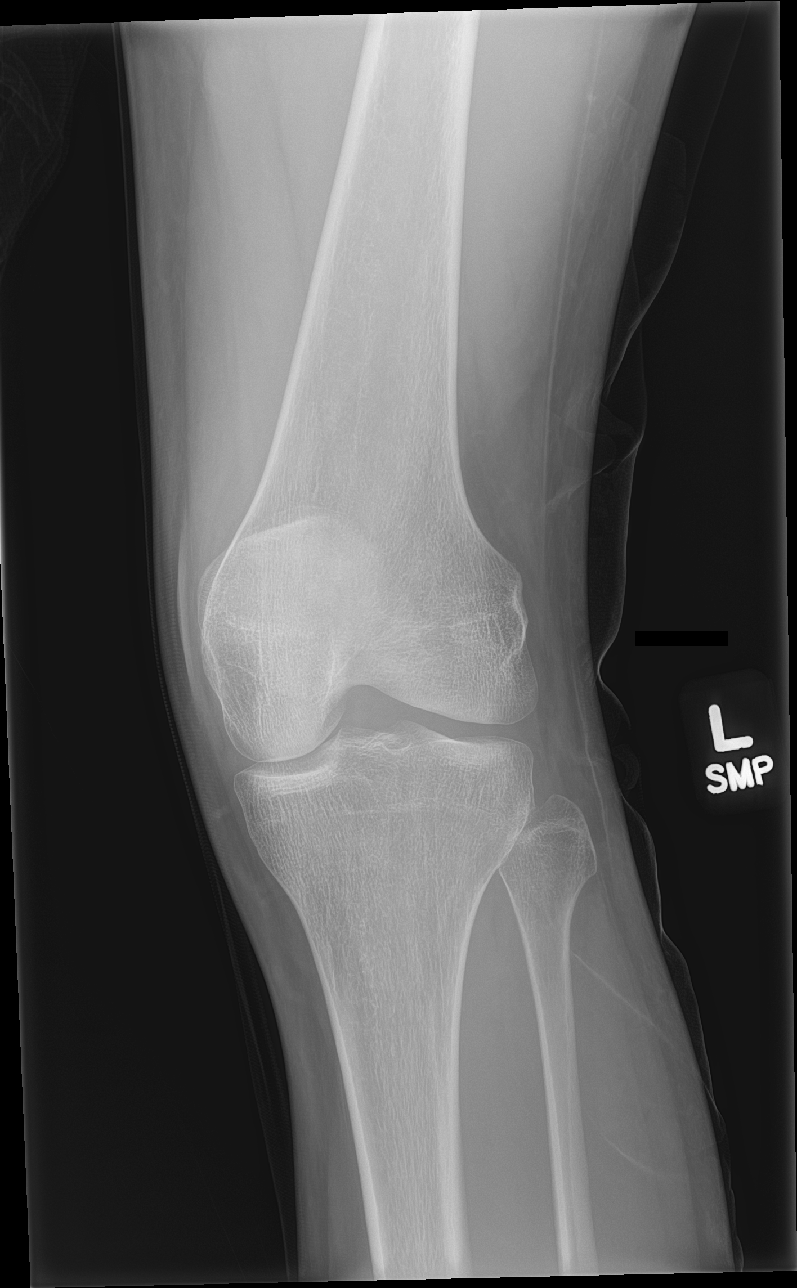

[knee obl (2 of 2)]
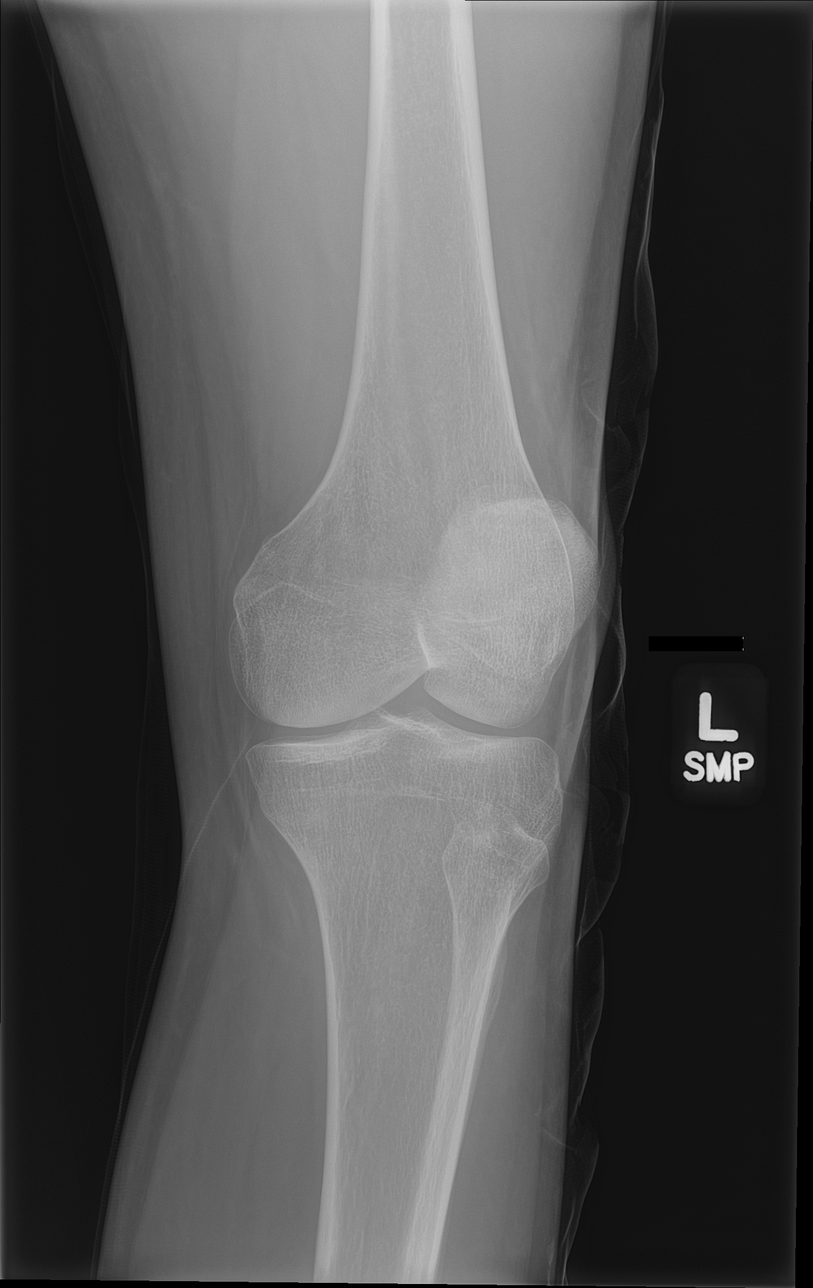

[knee lat]
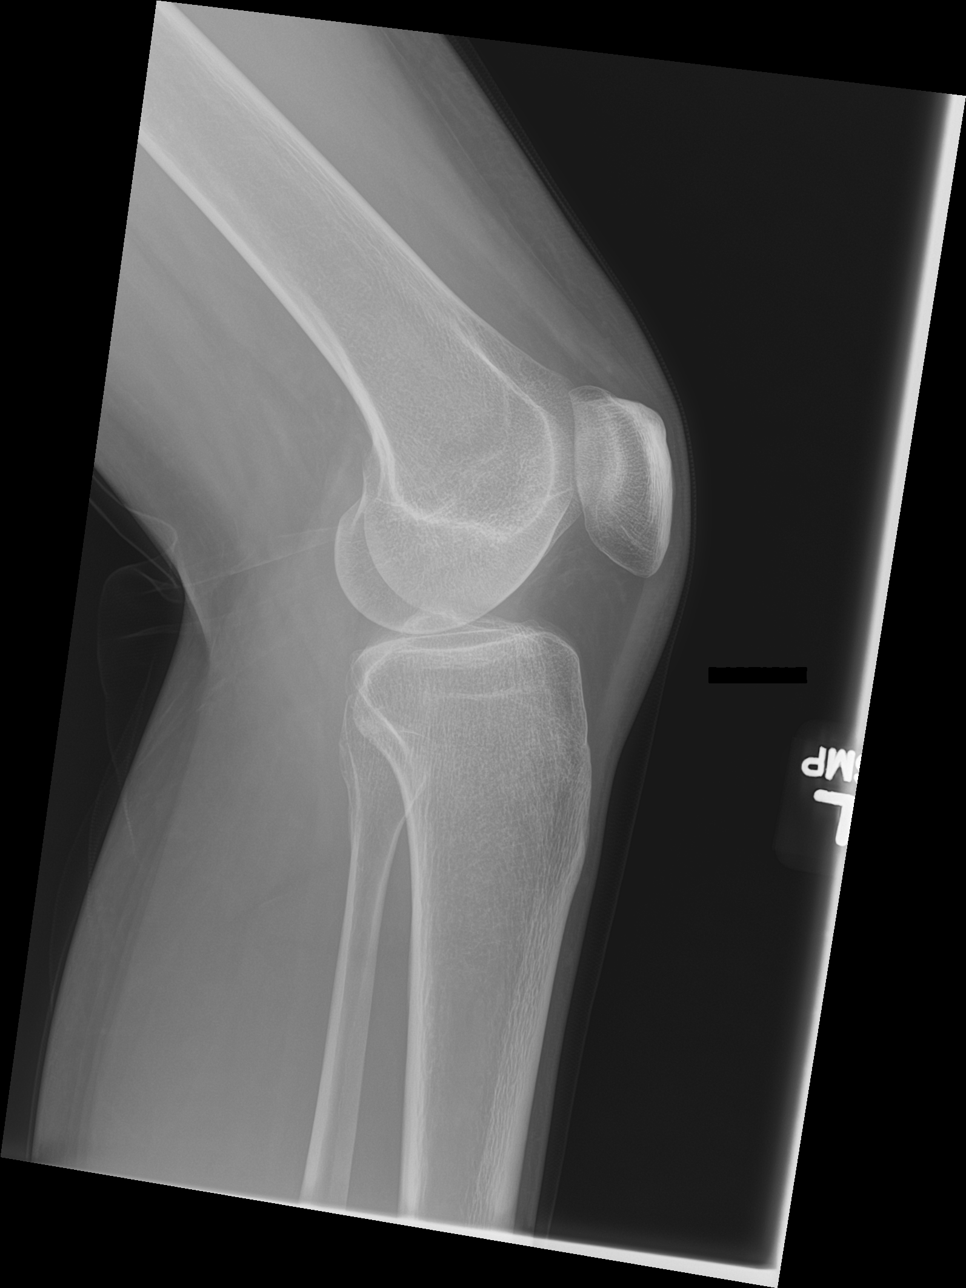

[4 of 4 positions shown; findings below may reference images not displayed]

FINDINGS: No evidence of fracture, dislocation, or joint effusion. No evidence
of arthropathy or other focal bone abnormality. Soft tissues are
unremarkable.
IMPRESSION: Normal examination.

## 2021-11-10 MED ORDER — ONDANSETRON 4 MG PO TBDP: 4.0000 mg | ORAL_TABLET | Freq: Three times a day (TID) | ORAL | 0 refills | Status: DC | PRN

## 2021-11-10 MED ORDER — ACETAMINOPHEN 325 MG PO TABS
650.0000 mg | ORAL_TABLET | Freq: Once | ORAL | Status: AC
  Administered 2021-11-10: 650 mg via ORAL
  Filled 2021-11-10: qty 2

## 2021-11-10 MED ORDER — POTASSIUM CHLORIDE CRYS ER 20 MEQ PO TBCR
40.0000 meq | EXTENDED_RELEASE_TABLET | Freq: Once | ORAL | Status: AC
  Administered 2021-11-10: 40 meq via ORAL
  Filled 2021-11-10: qty 2

## 2021-11-10 MED ORDER — SODIUM CHLORIDE 0.9 % IV BOLUS
1000.0000 mL | Freq: Once | INTRAVENOUS | Status: AC
  Administered 2021-11-10: 1000 mL via INTRAVENOUS

## 2021-11-10 NOTE — ED Notes (Signed)
Pt c/o headache rating 7. PA aware

## 2021-11-10 NOTE — ED Triage Notes (Signed)
Nausea and vomiting for several days, also has pain in left foot after a fall. States diarrhea is nonstop

## 2021-11-10 NOTE — ED Provider Notes (Signed)
[facility] EMERGENCY DEPARTMENT Provider Note   CSN: [id number] Arrival date & time: 11/10/21  1406     History  No chief complaint on file.   Delena BaliVictoria B Daughdrill is a 26 y.o. female who presents to the ED due to nausea, vomiting, and diarrhea x4 days.  Patient seen in the ED on Monday for the same complaint with reassuring work-up.  Patient discharged with Bentyl and Zofran which she has been using with moderate relief.  Patient states she has also been taking Imodium with no relief of the diarrhea.  Patient states she is mostly just having diarrhea at this point.  Denies melena, hematochezia, and hematemesis.  No fever or chills.  Patient states she has had decreased p.o. intake over the past few days due to illness.  Admits to some mild abdominal cramps. No urinary or vaginal symptoms. Patient also endorses left foot and knee pain after a fall that occurred a few days ago.     Home Medications Prior to Admission medications   Medication Sig Start Date End Date Taking? Authorizing Provider  ondansetron (ZOFRAN-ODT) 4 MG disintegrating tablet Take 1 tablet (4 mg total) by mouth every 8 (eight) hours as needed for nausea or vomiting. 11/10/21  Yes Emilina Smarr, Merla Richesaroline C, PA-C  dicyclomine (BENTYL) 20 MG tablet Take 1 pill for every 8-12 hours as needed for abdominal cramping 11/07/21   Bethann BerkshireZammit, Joseph, MD  loperamide (IMODIUM A-D) 2 MG tablet Take 1 tablet (2 mg total) by mouth 4 (four) times daily as needed for diarrhea or loose stools. Patient not taking: Reported on 04/08/2019 03/05/19   Vanetta MuldersZackowski, Scott, MD  ondansetron (ZOFRAN-ODT) 4 MG disintegrating tablet 4mg  ODT q4 hours prn nausea/vomit 11/07/21   Bethann BerkshireZammit, Joseph, MD  vancomycin (VANCOCIN) 125 MG capsule Take 1 capsule (125 mg total) by mouth 4 (four) times daily. Patient not taking: Reported on 04/08/2019 03/13/19   Len BlalockSetzer, Terri L, NP      Allergies    Penicillins, Sulfa antibiotics, and Keflex [cephalexin]    Review of Systems    Review of Systems  Constitutional:  Negative for chills and fever.  Gastrointestinal:  Positive for abdominal pain, diarrhea, nausea and vomiting.  Genitourinary:  Negative for dysuria and vaginal discharge.   Physical Exam Updated Vital Signs BP (!) 93/59 (BP Location: Left Arm)    Pulse 67    Temp 98.4 F (36.9 C) (Oral)    Resp 15    Ht 5\' 6"  (1.676 m)    Wt 51.3 kg    LMP 10/31/2021 (Approximate)    SpO2 100%    BMI 18.24 kg/m  Physical Exam Vitals and nursing note reviewed.  Constitutional:      General: She is not in acute distress.    Appearance: She is not ill-appearing.  HENT:     Head: Normocephalic.  Eyes:     Pupils: Pupils are equal, round, and reactive to light.  Cardiovascular:     Rate and Rhythm: Normal rate and regular rhythm.     Pulses: Normal pulses.     Heart sounds: Normal heart sounds. No murmur heard.   No friction rub. No gallop.  Pulmonary:     Effort: Pulmonary effort is normal.     Breath sounds: Normal breath sounds.  Abdominal:     General: Abdomen is flat. There is no distension.     Palpations: Abdomen is soft.     Tenderness: There is abdominal tenderness. There is no guarding or rebound.  Comments: Mild diffuse tenderness without rebound or guarding.  Musculoskeletal:        General: Normal range of motion.     Cervical back: Neck supple.  Skin:    General: Skin is warm and dry.  Neurological:     General: No focal deficit present.     Mental Status: She is alert.  Psychiatric:        Mood and Affect: Mood normal.        Behavior: Behavior normal.    ED Results / Procedures / Treatments   Labs (all labs ordered are listed, but only abnormal results are displayed) Labs Reviewed  COMPREHENSIVE METABOLIC PANEL - Abnormal; Notable for the following components:      Result Value   Potassium 3.0 (*)    Calcium 8.3 (*)    Anion gap 4 (*)    All other components within normal limits  URINALYSIS, ROUTINE W REFLEX MICROSCOPIC -  Abnormal; Notable for the following components:   APPearance HAZY (*)    Hgb urine dipstick SMALL (*)    Bacteria, UA MANY (*)    All other components within normal limits  RESP PANEL BY RT-PCR (FLU A&B, COVID) ARPGX2  CBC WITH DIFFERENTIAL/PLATELET  LIPASE, BLOOD  I-STAT BETA HCG BLOOD, ED ([facility], [facility], [facility] ONLY)    EKG None  Radiology DG Knee Complete 4 Views Left  Result Date: 11/10/2021 CLINICAL DATA:  Left foot pain, sometimes radiating into the left knee, since a fall last night. EXAM: LEFT KNEE - COMPLETE 4+ VIEW COMPARISON:  None. FINDINGS: No evidence of fracture, dislocation, or joint effusion. No evidence of arthropathy or other focal bone abnormality. Soft tissues are unremarkable. IMPRESSION: Normal examination. Electronically Signed   By: Beckie Salts M.D.   On: 11/10/2021 18:48   DG Foot Complete Left  Result Date: 11/10/2021 CLINICAL DATA:  Left foot pain following a fall last night. EXAM: LEFT FOOT - COMPLETE 3+ VIEW COMPARISON:  None. FINDINGS: There is no evidence of fracture or dislocation. There is no evidence of arthropathy or other focal bone abnormality. Soft tissues are unremarkable. IMPRESSION: Normal examination. Electronically Signed   By: Beckie Salts M.D.   On: 11/10/2021 18:47    Procedures Procedures    Medications Ordered in ED Medications  sodium chloride 0.9 % bolus 1,000 mL (0 mLs Intravenous Stopped 11/10/21 1653)  acetaminophen (TYLENOL) tablet 650 mg (650 mg Oral Given 11/10/21 1710)  potassium chloride SA (KLOR-CON M) CR tablet 40 mEq (40 mEq Oral Given 11/10/21 1711)    ED Course/ Medical Decision Making/ A&P                           Medical Decision Making Amount and/or Complexity of Data Reviewed Independent Historian:     Details: Sister at bedside External Data Reviewed: notes. Labs: ordered. Decision-making details documented in ED Course. Radiology: ordered and independent interpretation performed. Decision-making details documented in  ED Course.  Risk OTC drugs. Prescription drug management.  This patient presents to the ED for concern of nausea, vomiting, and diarrhea this involves an extensive number of treatment options, and is a complaint that carries with it a high risk of complications and morbidity.  The differential diagnosis includes gastroenteritis, acute abdomen, UTI, COVID, influenza  26 year old female presents to the ED due to nausea, vomiting, and diarrhea x4 days.  Patient seen in the ED on Monday for the same complaint and was diagnosed with gastroenteritis.  Patient admits to decreased p.o. intake.  She notes nausea and vomiting have improved however, diarrhea has worsened. Patient also admits to left foot/knee pain after a fall that occurred a few days ago. No head injury or LOC. Upon arrival, stable vitals.  Patient no acute distress.  Physical exam significant for mild abdominal tenderness without rebound or guarding.  Low suspicion for acute abdomen.  Routine labs ordered to rule out electrolyte abnormalities and check renal function.  COVID/influenza test.  IV fluids given.  CBC unremarkable.  No leukocytosis and normal hemoglobin.  CMP significant for hypokalemia at 3.  Potassium repleted here in the ED.  COVID/influenza negative.  UA negative for signs of infection.  Lipase normal.  Doubt pancreatitis.  On repeat abdominal exam, abdomen soft, nondistended with mild tenderness.  Low suspicion for acute abdomen.  Suspect viral gastroenteritis.  After IV fluids, patient admits to feeling improved.  Discussed patient course of gastroenteritis.  Patient discharged with Zofran.  X-rays personally reviewed and interpreted which are negative for any bony fractures.  Agree with radiology interpretation. Strict ED precautions discussed with patient. Patient states understanding and agrees to plan. Patient discharged home in no acute distress and stable vitals.  Critical Interventions:  IVFs  Reevaluation:  After  the interventions noted above, I reevaluated the patient and found that they have :improved        Final Clinical Impression(s) / ED Diagnoses Final diagnoses:  Nausea vomiting and diarrhea  Left foot pain    Rx / DC Orders ED Discharge Orders          Ordered    ondansetron (ZOFRAN-ODT) 4 MG disintegrating tablet  Every 8 hours PRN        11/10/21 1858              Jesusita Oka 11/10/21 1901    Benjiman Core, MD 11/11/21 [phone]

## 2021-11-10 NOTE — Discharge Instructions (Addendum)
As discussed, I suspect you have a viral infection causing the nausea, vomiting, and diarrhea.  Continue to drink extra fluids.  The virus needs to run its course.  I am discharging you with Zofran.  Take as needed for nausea.  Your x-rays of your foot and knee were negative for any broken bones.  You may take over-the-counter Tylenol as needed for pain.  Return to the ER for new or worsening symptoms.

## 2021-11-10 NOTE — ED Notes (Signed)
See triage notes. Mm wet. Nad at this time

## 2022-01-11 ENCOUNTER — Other Ambulatory Visit: Payer: Self-pay

## 2022-01-11 ENCOUNTER — Emergency Department (HOSPITAL_COMMUNITY)
Admission: EM | Admit: 2022-01-11 | Discharge: 2022-01-12 | Disposition: A | Discharge status: Home / Self Care | Payer: Self-pay | Attending: Student | Admitting: Student

## 2022-01-11 ENCOUNTER — Encounter (HOSPITAL_COMMUNITY): Payer: Self-pay

## 2022-01-11 DIAGNOSIS — R531 Weakness: Secondary | ICD-10-CM | POA: Insufficient documentation

## 2022-01-11 DIAGNOSIS — M546 Pain in thoracic spine: Secondary | ICD-10-CM | POA: Insufficient documentation

## 2022-01-11 DIAGNOSIS — R519 Headache, unspecified: Secondary | ICD-10-CM | POA: Insufficient documentation

## 2022-01-11 DIAGNOSIS — R200 Anesthesia of skin: Secondary | ICD-10-CM | POA: Insufficient documentation

## 2022-01-11 LAB — CBC WITH DIFFERENTIAL/PLATELET
Abs Immature Granulocytes: 0.02 10*3/uL (ref 0.00–0.07)
Basophils Absolute: 0.1 10*3/uL (ref 0.0–0.1)
Basophils Relative: 1 %
Eosinophils Absolute: 0.3 10*3/uL (ref 0.0–0.5)
Eosinophils Relative: 2 %
HCT: 39.7 % (ref 36.0–46.0)
Hemoglobin: 13.1 g/dL (ref 12.0–15.0)
Immature Granulocytes: 0 %
Lymphocytes Relative: 35 %
Lymphs Abs: 3.7 10*3/uL (ref 0.7–4.0)
MCH: 30.5 pg (ref 26.0–34.0)
MCHC: 33 g/dL (ref 30.0–36.0)
MCV: 92.3 fL (ref 80.0–100.0)
Monocytes Absolute: 0.8 10*3/uL (ref 0.1–1.0)
Monocytes Relative: 8 %
Neutro Abs: 5.8 10*3/uL (ref 1.7–7.7)
Neutrophils Relative %: 54 %
Platelets: 354 10*3/uL (ref 150–400)
RBC: 4.3 MIL/uL (ref 3.87–5.11)
RDW: 13.7 % (ref 11.5–15.5)
WBC: 10.6 10*3/uL — ABNORMAL HIGH (ref 4.0–10.5)
nRBC: 0 % (ref 0.0–0.2)

## 2022-01-11 LAB — BASIC METABOLIC PANEL
Anion gap: 4 — ABNORMAL LOW (ref 5–15)
BUN: 10 mg/dL (ref 6–20)
CO2: 25 mmol/L (ref 22–32)
Calcium: 8.5 mg/dL — ABNORMAL LOW (ref 8.9–10.3)
Chloride: 110 mmol/L (ref 98–111)
Creatinine, Ser: 0.53 mg/dL (ref 0.44–1.00)
GFR, Estimated: >60 mL/min (ref >60)
Glucose, Bld: 87 mg/dL (ref 70–99)
Potassium: 3.7 mmol/L (ref 3.5–5.1)
Sodium: 139 mmol/L (ref 135–145)

## 2022-01-11 NOTE — ED Provider Notes (Addendum)
?[facility] ?Provider Note ? ? ?CSN: [id number] ?Arrival date & time: 01/11/22  2017 ? ?  ? ?History ? ?Chief Complaint  ?Patient presents with  ? Back Pain  ? ? ?Sheila Lyons is a 26 y.o. female.  With no significant past medical history who presents to the emergency department with back pain. ? ?Patient states that for 1 week she has had mid thoracic back pain.  She states that this has progressed over the past week to having initially left leg "achiness and throbbing."  She states that "sometimes it is numb if I am sitting still."  She states that at work today she began having weakness in her left arm.  She states that her left arm also felt achy and she was unable to carry a tray because her arm began shaking.  She denies any visual changes, new headaches from baseline (history of migraines), no recent illnesses.  Denies fevers, trauma or injury to the back. ? ? ? ? ?Back Pain ?Associated symptoms: headaches, numbness and weakness   ? ?  ? ?Home Medications ?Prior to Admission medications   ?Medication Sig Start Date End Date Taking? Authorizing Provider  ?dicyclomine (BENTYL) 20 MG tablet Take 1 pill for every 8-12 hours as needed for abdominal cramping 11/07/21   Milton Ferguson, MD  ?loperamide (IMODIUM A-D) 2 MG tablet Take 1 tablet (2 mg total) by mouth 4 (four) times daily as needed for diarrhea or loose stools. ?Patient not taking: Reported on 04/08/2019 03/05/19   Fredia Sorrow, MD  ?ondansetron (ZOFRAN-ODT) 4 MG disintegrating tablet 4mg  ODT q4 hours prn nausea/vomit 11/07/21   Milton Ferguson, MD  ?ondansetron (ZOFRAN-ODT) 4 MG disintegrating tablet Take 1 tablet (4 mg total) by mouth every 8 (eight) hours as needed for nausea or vomiting. 11/10/21   Suzy Bouchard, PA-C  ?vancomycin (VANCOCIN) 125 MG capsule Take 1 capsule (125 mg total) by mouth 4 (four) times daily. ?Patient not taking: Reported on 04/08/2019 03/13/19   Butch Penny, NP  ?   ? ?Allergies    ?Penicillins,  Sulfa antibiotics, and Keflex [cephalexin]   ? ?Review of Systems   ?Review of Systems  ?Musculoskeletal:  Positive for back pain.  ?Neurological:  Positive for weakness, numbness and headaches.  ?All other systems reviewed and are negative. ? ?Physical Exam ?Updated Vital Signs ?BP 123/75 (BP Location: Right Arm)   Pulse 84   Temp 98.6 ?F (37 ?C) (Oral)   Resp 17   Ht 5\' 6"  (1.676 m)   Wt 52 kg   LMP 12/06/2021 (Exact Date)   SpO2 100%   BMI 18.50 kg/m?  ?Physical Exam ?Vitals and nursing note reviewed.  ?Constitutional:   ?   General: She is not in acute distress. ?   Appearance: Normal appearance. She is not ill-appearing or toxic-appearing.  ?HENT:  ?   Head: Normocephalic and atraumatic.  ?   Nose: Nose normal.  ?   Mouth/Throat:  ?   Mouth: Mucous membranes are moist.  ?   Pharynx: Oropharynx is clear.  ?Eyes:  ?   General: No visual field deficit or scleral icterus. ?   Extraocular Movements: Extraocular movements intact.  ?   Pupils: Pupils are equal, round, and reactive to light.  ?Cardiovascular:  ?   Rate and Rhythm: Normal rate and regular rhythm.  ?   Pulses: Normal pulses.  ?   Heart sounds: No murmur heard. ?Pulmonary:  ?   Effort: Pulmonary effort  is normal. No respiratory distress.  ?   Breath sounds: Normal breath sounds.  ?Abdominal:  ?   Palpations: Abdomen is soft.  ?Musculoskeletal:     ?   General: No tenderness. Normal range of motion.  ?   Cervical back: Normal range of motion and neck supple. No rigidity or tenderness.  ?   Thoracic back: Bony tenderness present.  ?   Comments: No step-offs or deformities  ?Skin: ?   General: Skin is warm and dry.  ?   Capillary Refill: Capillary refill takes less than 2 seconds.  ?   Findings: No rash.  ?Neurological:  ?   Mental Status: She is alert and oriented to person, place, and time.  ?   GCS: GCS eye subscore is 4. GCS verbal subscore is 5. GCS motor subscore is 6.  ?   Cranial Nerves: Cranial nerves 2-12 are intact. No cranial nerve  deficit, dysarthria or facial asymmetry.  ?   Sensory: Sensory deficit present.  ?   Motor: Weakness present. No tremor.  ?   Coordination: Coordination is intact.  ?   Gait: Gait is intact.  ?   Comments: 4/5 grip strength of left upper extremity ?4/5 strength to left lower extremity  ?Psychiatric:     ?   Mood and Affect: Mood normal.     ?   Behavior: Behavior normal.     ?   Thought Content: Thought content normal.     ?   Judgment: Judgment normal.  ? ? ?ED Results / Procedures / Treatments   ?Labs ?(all labs ordered are listed, but only abnormal results are displayed) ?Labs Reviewed  ?BASIC METABOLIC PANEL - Abnormal; Notable for the following components:  ?    Result Value  ? Calcium 8.5 (*)   ? Anion gap 4 (*)   ? All other components within normal limits  ?CBC WITH DIFFERENTIAL/PLATELET - Abnormal; Notable for the following components:  ? WBC 10.6 (*)   ? All other components within normal limits  ? ?EKG ?None ? ?Radiology ?No results found. ? ?Procedures ?Procedures  ? ? ?Medications Ordered in ED ?Medications - No data to display ? ?ED Course/ Medical Decision Making/ A&P ?  ?                        ?Medical Decision Making ?Amount and/or Complexity of Data Reviewed ?Labs: ordered. ?Radiology: ordered. ? ?This patient presents to the ED for concern of neck pain and weakness, this involves an extensive number of treatment options, and is a complaint that carries with it a high risk of complications and morbidity.  ? ?Co morbidities that complicate the patient evaluation ?None ? ?Additional history obtained:  ?Additional history obtained from: Father at bedside ?External records from outside source obtained and reviewed including: Previous ED visits ? ?Lab Results: ?I personally ordered, reviewed, and interpreted labs. ?Pertinent results include: ?CBC and BMP unremarkable ? ?Imaging Studies ordered:  ?I ordered imaging studies which included MRI.  I independently reviewed & interpreted imaging & am in  agreement with radiology impression. ?Imaging shows: ?MRI pending ? ?Consultations: ?I requested consultation with the neurologist, Dr. Lorrin Goodell and discussed lab and imaging findings as well as pertinent plan - they recommend: ED to ED transfer for MRI with and without of brain, C-spine, T-spine ? ?ED Course: ?26 year old female who presents emergency department with back pain and weakness of her left upper and lower extremity. ? ?Physical  exam as noted above. ?Physical exam is concerning for multiple sclerosis or other neurological syndrome.  She has no stroke symptoms at this time.  Basic labs have been obtained in anticipation for MRI. ?Dr. Lorrin Goodell, neurology at [facility] states that patient should be transferred for ED to ED transfer for MRI of the brain, C-spine and T-spine. ? ?Spoke with [facility] doc Dr. Pearline Cables who is aware of patient transfer.  Patient will be going by private vehicle with father. ? ?After consideration of the diagnostic results and the patients response to treatment, I feel that the patent would benefit from ED transfer. ?The patient has been appropriately medically screened and/or stabilized in the ED. I have low suspicion for any other emergent medical condition which would require further screening, evaluation or treatment in the ED  ?Final Clinical Impression(s) / ED Diagnoses ?Final diagnoses:  ?None  ? ? ?Rx / DC Orders ?ED Discharge Orders   ? ? None  ? ?  ? ? ?  ?Mickie Hillier, PA-C ?01/11/22 2321 ? ?  ?Mickie Hillier, PA-C ?01/11/22 2322 ? ?  ?Teressa Lower, MD ?01/12/22 0117 ? ?

## 2022-01-11 NOTE — ED Triage Notes (Signed)
Pt arrived via POV c/o lumbar back pain radiating left leg and left arm numbness and tingling from elbow to finger tips. Pt ambulatory in Triage. Pt reports pain began at work while carrying  a tray with food on it to a table. Pt denies known injury.   ?

## 2022-01-12 ENCOUNTER — Encounter (HOSPITAL_COMMUNITY): Payer: Self-pay | Admitting: Student

## 2022-01-12 ENCOUNTER — Other Ambulatory Visit (HOSPITAL_COMMUNITY): Payer: Self-pay

## 2022-01-12 ENCOUNTER — Emergency Department (HOSPITAL_COMMUNITY): Payer: Self-pay

## 2022-01-12 LAB — I-STAT BETA HCG BLOOD, ED (MC, WL, AP ONLY): I-stat hCG, quantitative: <5 m[IU]/mL (ref <5)

## 2022-01-12 IMAGING — MR MR HEAD WO/W CM
1 series · 48 of 48 positions shown · IV contrast (gadavist)
Comparison: None available.

CLINICAL DATA: Initial evaluation for left-sided numbness for 2
days.

EXAM:
MRI HEAD WITHOUT AND WITH CONTRAST
TECHNIQUE: Multiplanar, multiecho pulse sequences of the brain and surrounding
structures were obtained without and with intravenous contrast.
CONTRAST:  5mL GADAVIST GADOBUTROL 1 MMOL/ML IV SOLN

[Series 1: T1 · axial · non-contrast · 3.0mm · 0.94mm/px · z∈[-96,+68]mm · 48 of 56 slices shown]
[im 1/56]
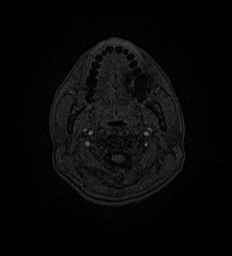
[im 2/56]
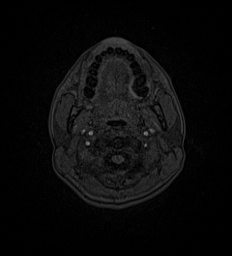
[im 3/56]
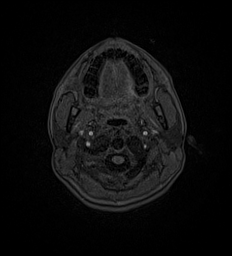
[im 4/56]
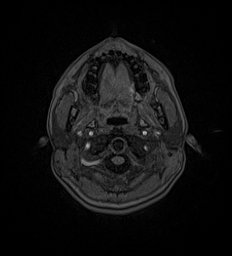
[im 5/56]
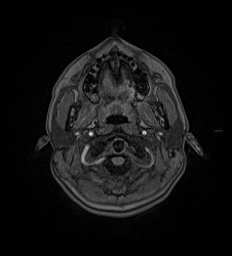
[im 6/56]
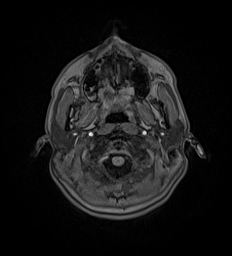
[im 8/56]
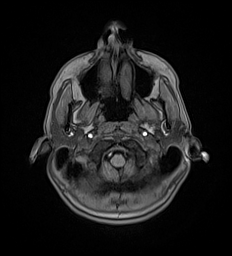
[im 9/56]
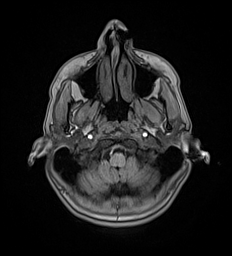
[im 10/56]
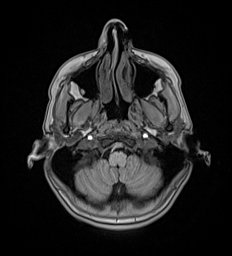
[im 11/56]
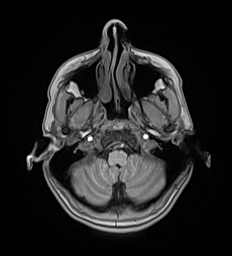
[im 12/56]
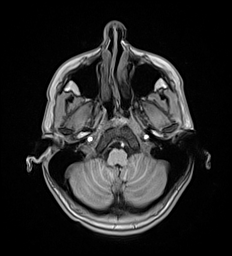
[im 13/56]
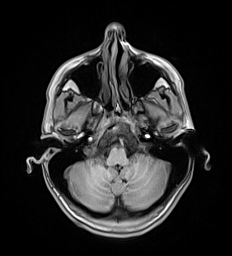
[im 15/56]
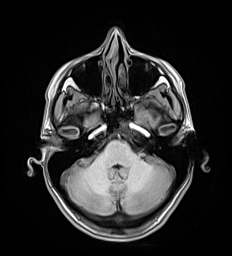
[im 16/56]
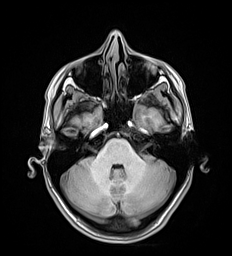
[im 17/56]
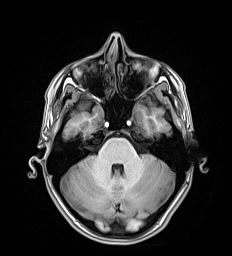
[im 18/56]
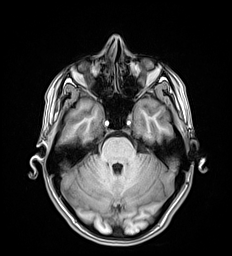
[im 19/56]
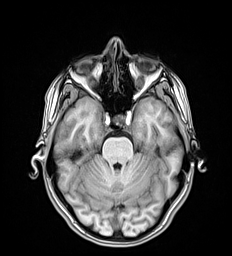
[im 20/56]
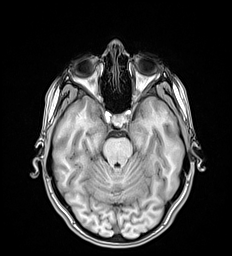
[im 22/56]
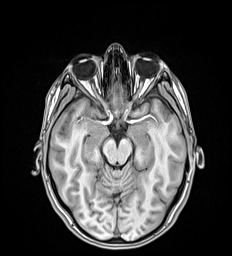
[im 23/56]
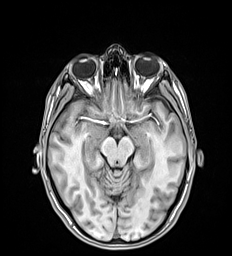
[im 24/56]
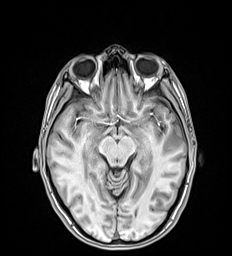
[im 25/56]
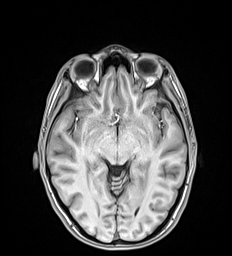
[im 26/56]
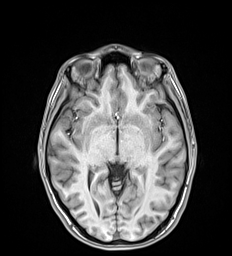
[im 27/56]
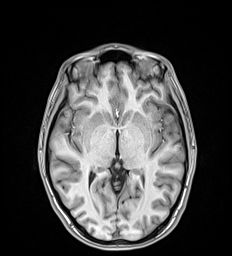
[im 29/56]
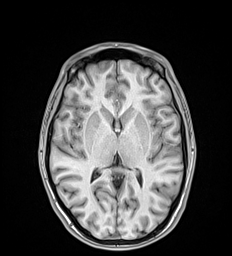
[im 30/56]
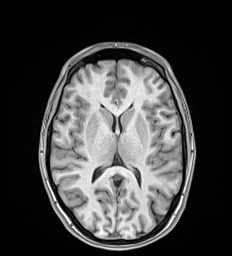
[im 31/56]
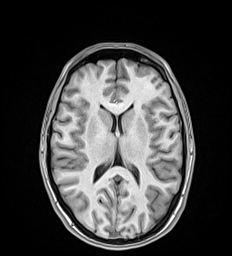
[im 32/56]
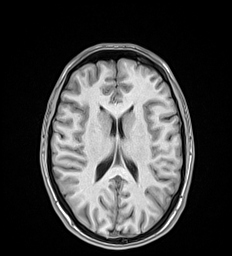
[im 33/56]
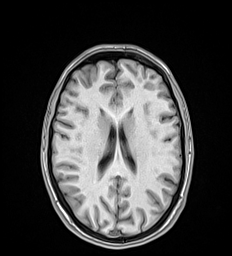
[im 34/56]
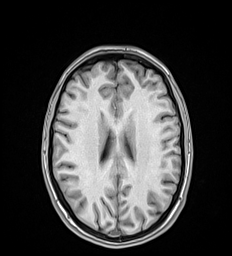
[im 36/56]
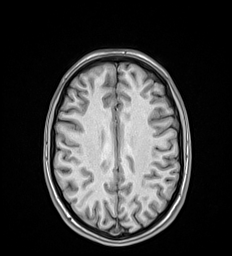
[im 37/56]
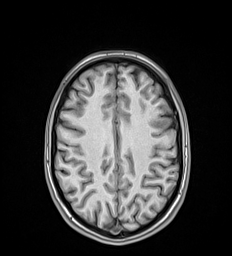
[im 38/56]
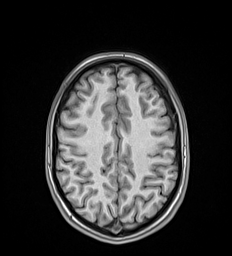
[im 39/56]
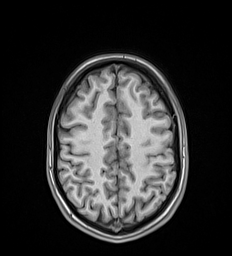
[im 40/56]
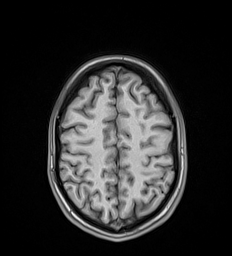
[im 41/56]
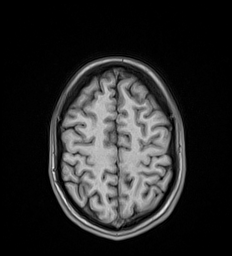
[im 43/56]
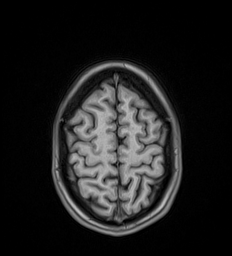
[im 44/56]
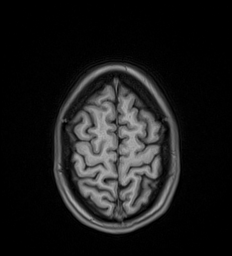
[im 45/56]
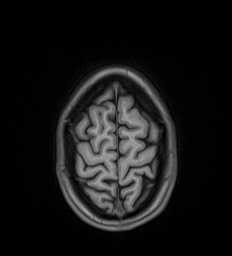
[im 46/56]
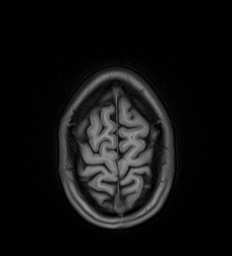
[im 47/56]
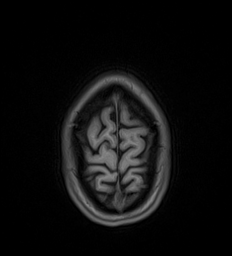
[im 48/56]
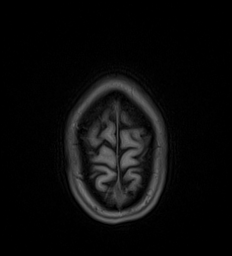
[im 50/56]
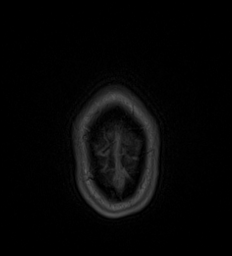
[im 51/56]
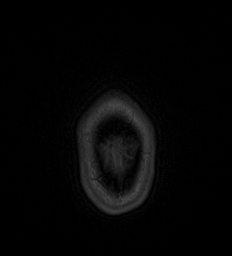
[im 52/56]
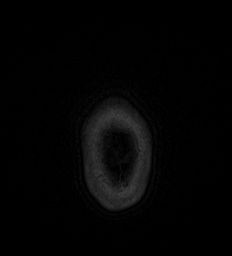
[im 53/56]
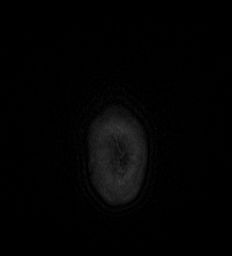
[im 54/56]
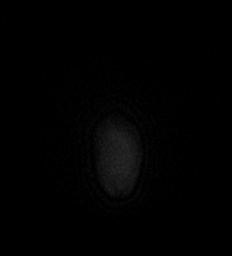
[im 56/56]
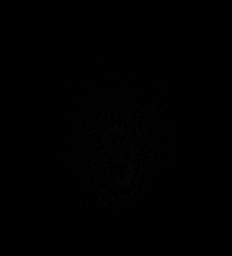

[48 of 48 positions shown; findings below may reference images not displayed]

FINDINGS: Brain: Cerebral volume within normal limits for patient age. No
focal parenchymal signal abnormality identified.

No abnormal foci of restricted diffusion to suggest acute or
subacute ischemia. Gray-white matter differentiation well
maintained. No encephalomalacia to suggest chronic infarction. No
foci of susceptibility artifact to suggest acute or chronic
intracranial hemorrhage.

No mass lesion, midline shift or mass effect. No hydrocephalus. No
extra-axial fluid collection.

Pituitary gland and suprasellar region are normal. Midline
structures intact and normal.

No abnormal enhancement.

Vascular: Major intracranial vascular flow voids well maintained.

Skull and upper cervical spine: Craniocervical junction normal.
Visualized upper cervical spine within normal limits. Bone marrow
signal intensity normal. No scalp soft tissue abnormality.

Sinuses/Orbits: Globes and orbital soft tissues within normal
limits.

Paranasal sinuses are largely clear. No mastoid effusion. Inner ear
structures grossly normal.

Other: None.
IMPRESSION: Normal brain MRI. No evidence for demyelinating disease or other
acute abnormality.

## 2022-01-12 IMAGING — MR MR CERVICAL SPINE WO/W CM
7 of 9 series · 29 of 48 positions shown · IV contrast (Gadavist)
Comparison: None.

CLINICAL DATA: Initial evaluation for left-sided arm and leg
weakness for 2 days.

EXAM:
MRI CERVICAL SPINE WITHOUT AND WITH CONTRAST
TECHNIQUE: Multiplanar and multiecho pulse sequences of the cervical spine, to
include the craniocervical junction and cervicothoracic junction,
were obtained without and with intravenous contrast.
CONTRAST:  5mL GADAVIST GADOBUTROL 1 MMOL/ML IV SOLN

[Series 6: T2 · sagittal · 3.0mm · 0.69mm/px · 3 of 17 slices shown (1 of 2)]
[im 1/17]
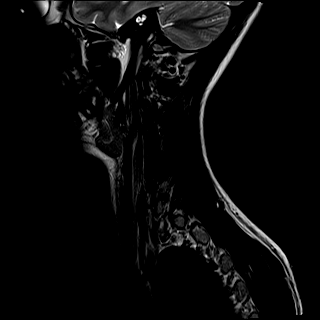
[im 9/17]
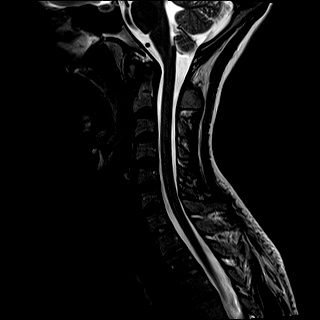
[im 17/17]
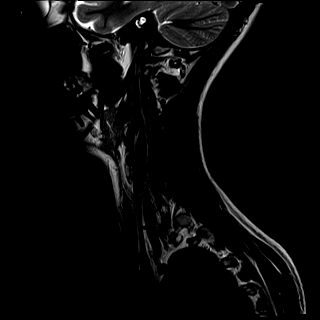

[Series 7: T1 · sagittal · 3.0mm · 0.69mm/px · 3 of 17 slices shown (1 of 2)]
[im 1/17]
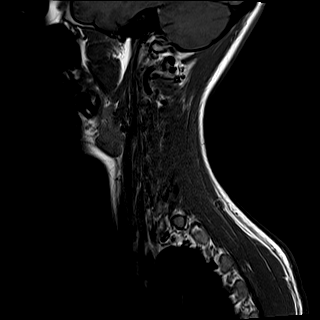
[im 9/17]
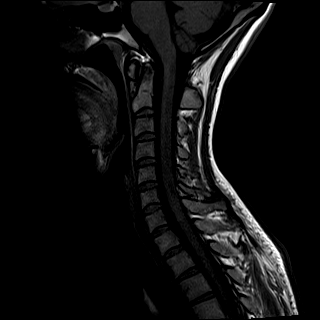
[im 17/17]
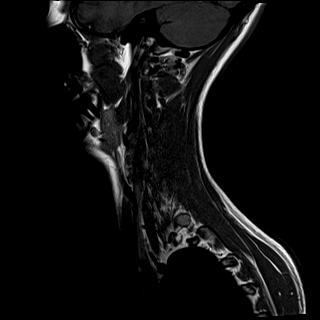

[Series 8: STIR · sagittal · 3.0mm · 0.86mm/px · 3 of 17 slices shown]
[im 1/17]
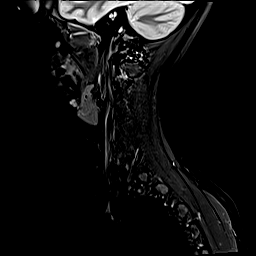
[im 9/17]
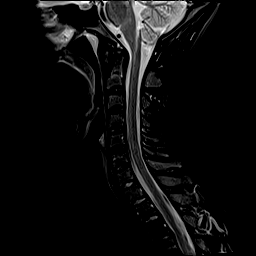
[im 17/17]
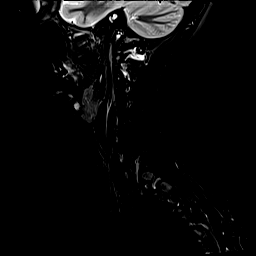

[Series 9: T2 · axial · 3.0mm · 0.62mm/px · z∈[-207,-66]mm · 7 of 44 slices shown (2 of 2)]
[im 1/44]
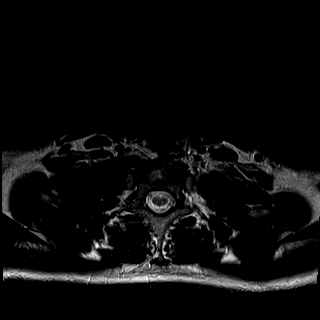
[im 8/44]
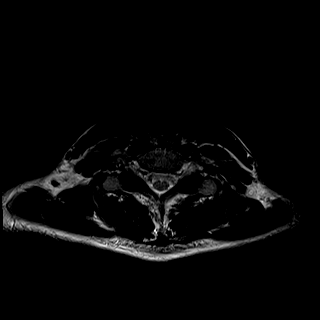
[im 15/44]
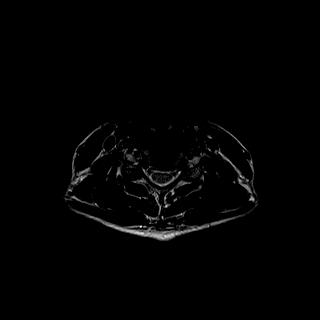
[im 22/44]
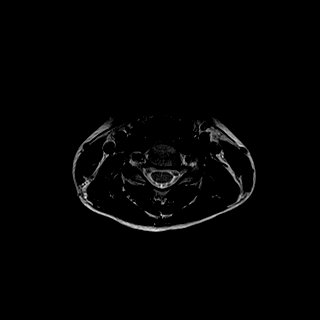
[im 29/44]
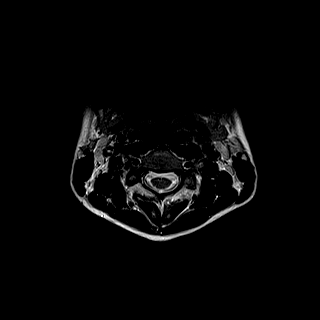
[im 36/44]
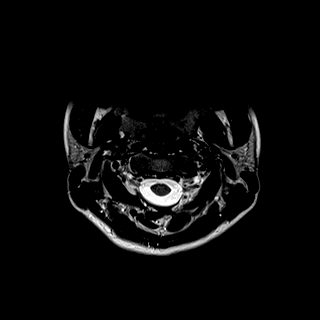
[im 44/44]
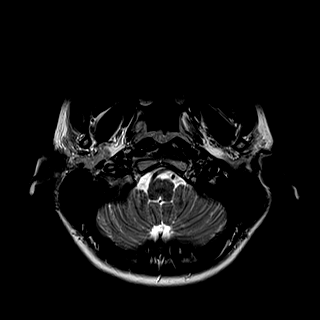

[Series 11: T1 · axial · 3.0mm · 0.39mm/px · z∈[-207,-66]mm · 7 of 44 slices shown (2 of 2)]
[im 1/44]
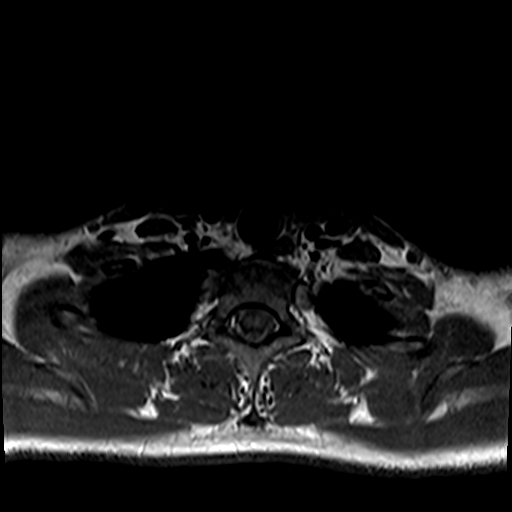
[im 8/44]
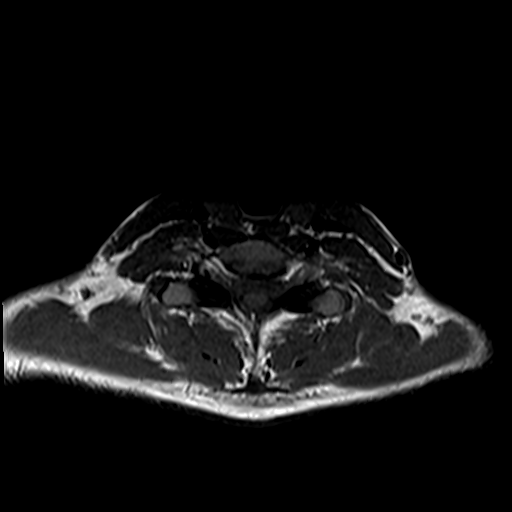
[im 15/44]
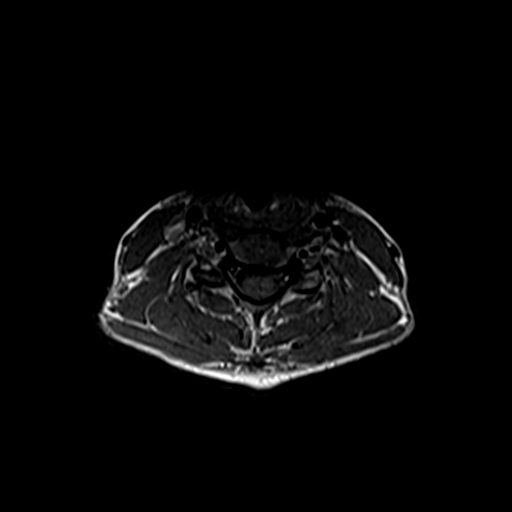
[im 22/44]
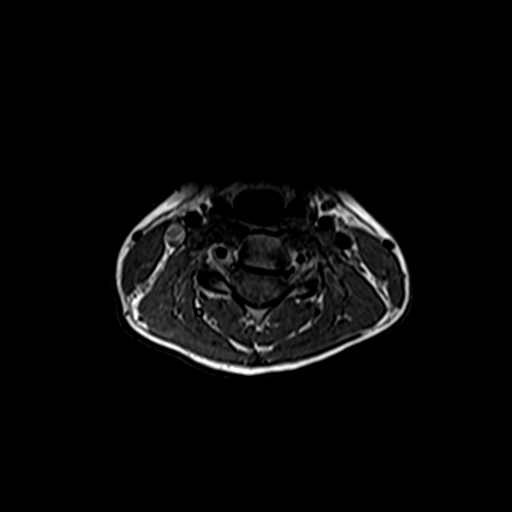
[im 29/44]
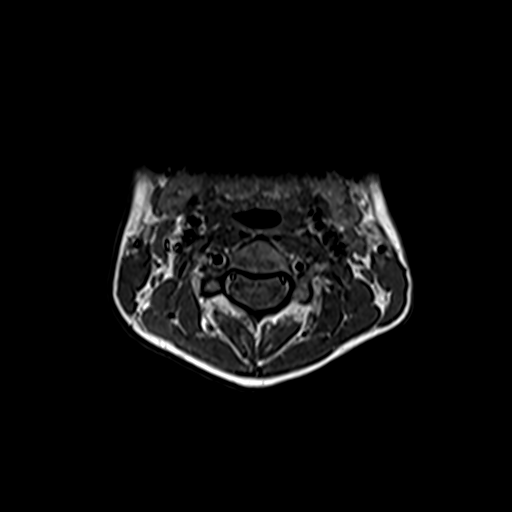
[im 36/44]
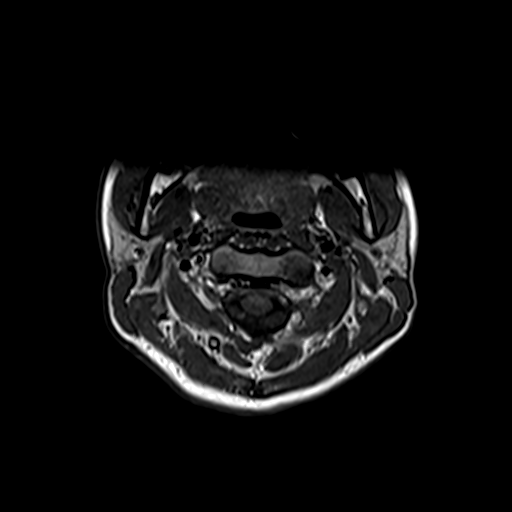
[im 44/44]
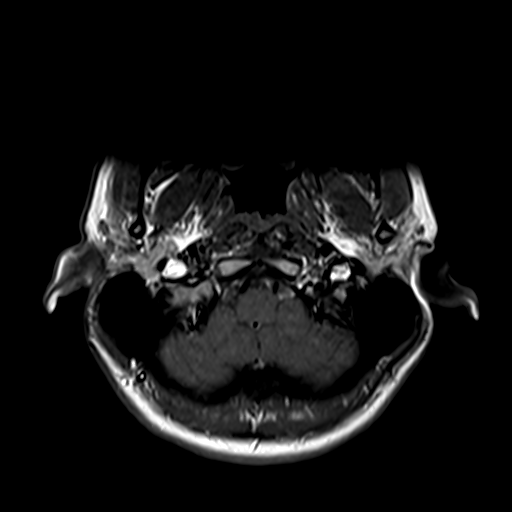

[Series 12: T1 fat-sat post-contrast · sagittal · 3.0mm · 0.43mm/px · 3 of 17 slices shown]
[im 1/17]
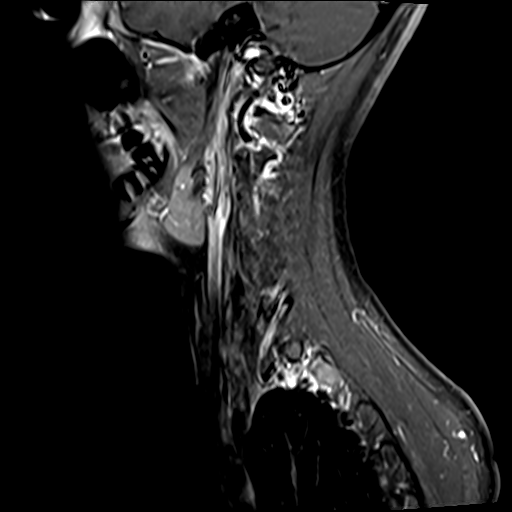
[im 9/17]
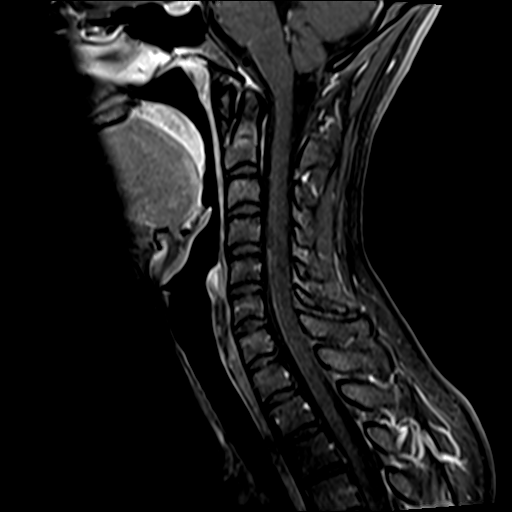
[im 17/17]
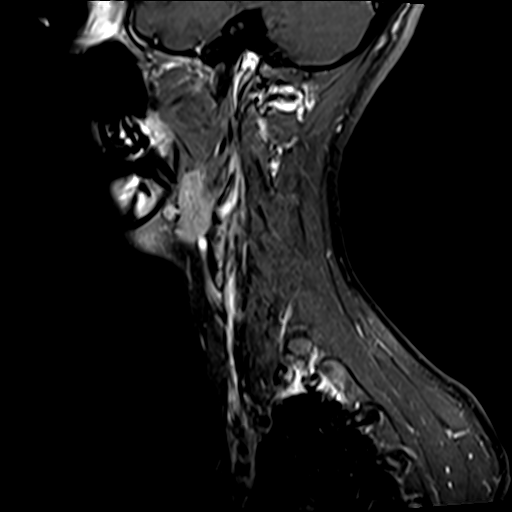

[Series 13: T1 post-contrast · axial · 3.0mm · 0.39mm/px · z∈[-207,-161]mm · 3 of 44 slices shown]
[im 1/44]
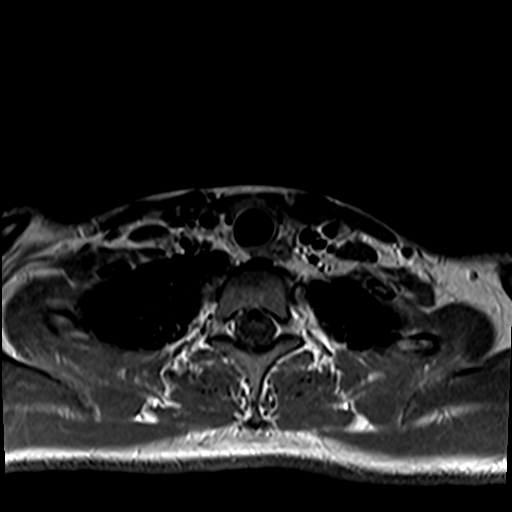
[im 8/44]
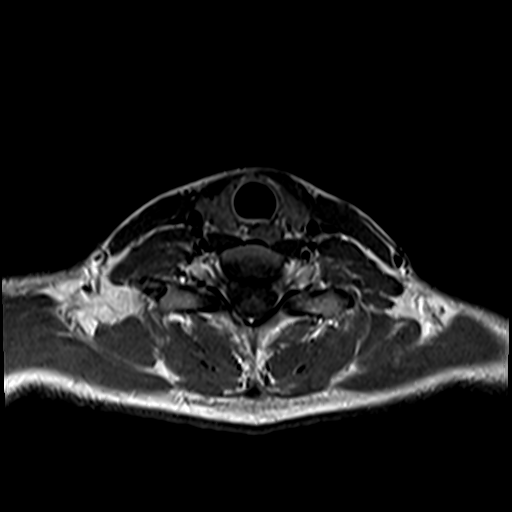
[im 15/44]
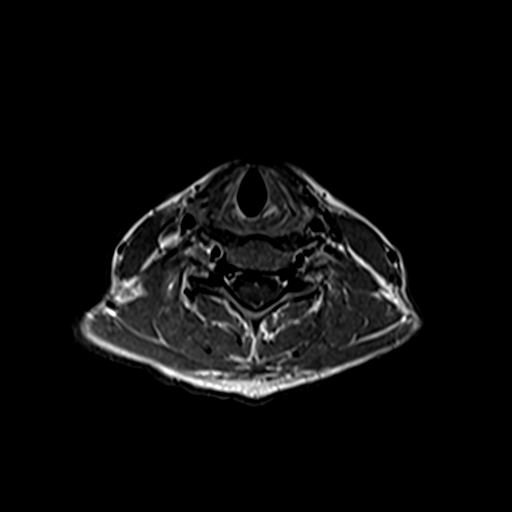

[29 of 48 positions shown; findings below may reference images not displayed]

FINDINGS: Alignment: Straightening of the normal cervical lordosis. No
listhesis.

Vertebrae: Vertebral body height maintained without acute or chronic
fracture. Bone marrow signal intensity within normal limits. No
discrete or worrisome osseous lesions. No abnormal marrow edema or
enhancement.

Cord: Normal signal morphology. No cord signal changes to suggest
demyelinating disease. No abnormal enhancement.

Posterior Fossa, vertebral arteries, paraspinal tissues:
Unremarkable.

Disc levels:

No significant disc pathology seen within the cervical spine. No
disc bulge or focal disc herniation. No significant facet pathology.
No canal or neural foraminal stenosis or evidence for neural
impingement.
IMPRESSION: Normal MRI of the cervical spine and spinal cord. No evidence for
demyelinating disease. No significant disc pathology, stenosis, or
evidence for neural impingement.

## 2022-01-12 IMAGING — MR MR THORACIC SPINE WO/W CM
8 of 14 series · 25 of 48 positions shown · IV contrast (gadavist)
Comparison: None.

CLINICAL DATA: Initial evaluation for left arm and leg weakness for
2 days.

EXAM:
MRI THORACIC WITHOUT AND WITH CONTRAST
TECHNIQUE: Multiplanar and multiecho pulse sequences of the thoracic spine were
obtained without and with intravenous contrast.
CONTRAST:  5mL GADAVIST GADOBUTROL 1 MMOL/ML IV SOLN

[Series 12: T1 · sagittal · 5.0mm · 1.46mm/px · 2 of 9 slices shown (1 of 5)]
[im 1/9]
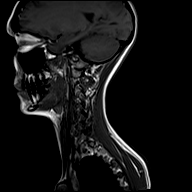
[im 9/9]
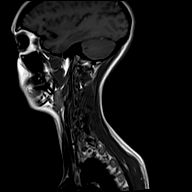

[Series 13: T1 · sagittal · 5.0mm · 1.23mm/px · 1 of 9 slices shown (2 of 5)]
[im 1/9]
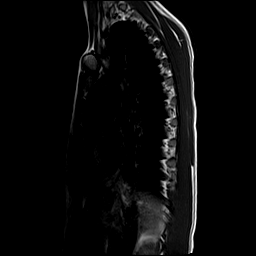

[Series 14: T1 · sagittal · 6.0mm · 1.23mm/px · 1 of 9 slices shown (3 of 5)]
[im 1/9]
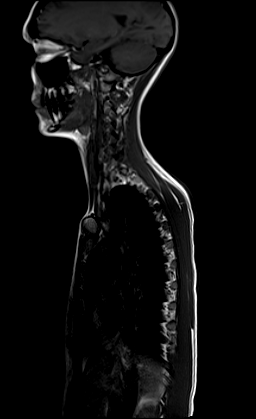

[Series 15: T2 · sagittal · 3.0mm · 0.80mm/px · 3 of 19 slices shown (1 of 2)]
[im 1/19]
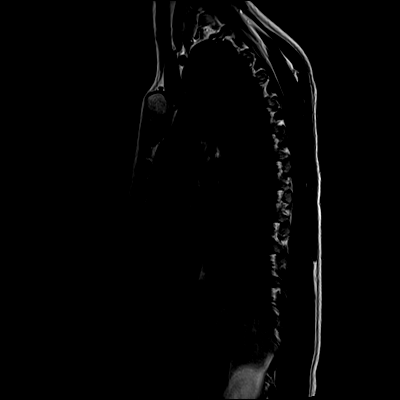
[im 10/19]
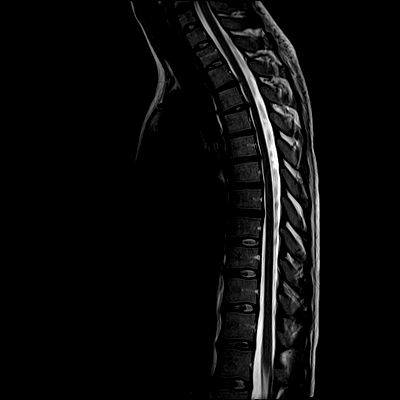
[im 19/19]
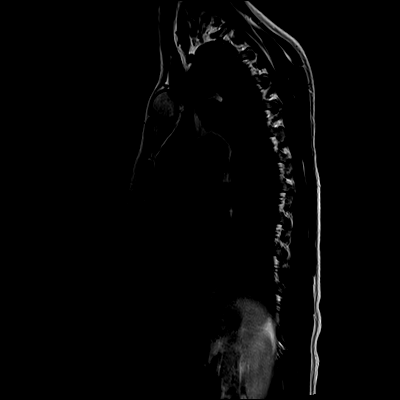

[Series 16: T1 · sagittal · 3.0mm · 0.71mm/px · 3 of 19 slices shown (4 of 5)]
[im 1/19]
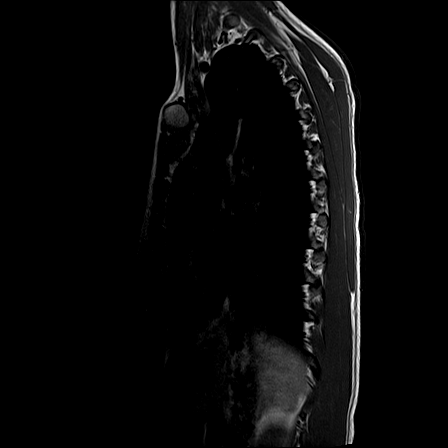
[im 10/19]
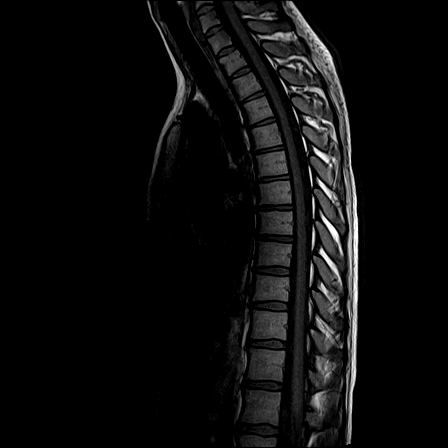
[im 19/19]
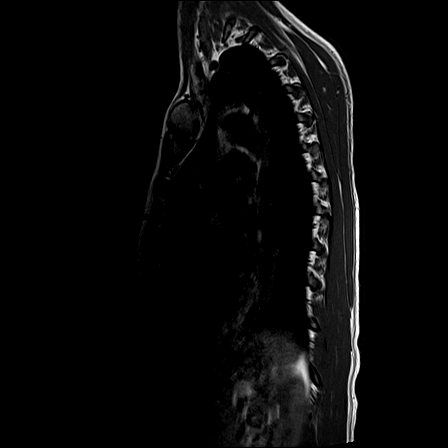

[Series 18: T2 · axial · 4.0mm · 0.59mm/px · z∈[-425,-214]mm · 6 of 39 slices shown (2 of 2)]
[im 1/39]
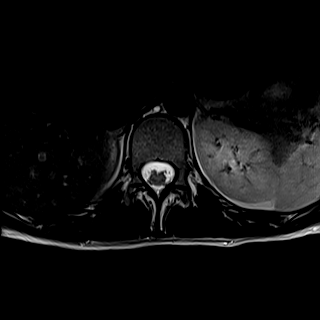
[im 8/39]
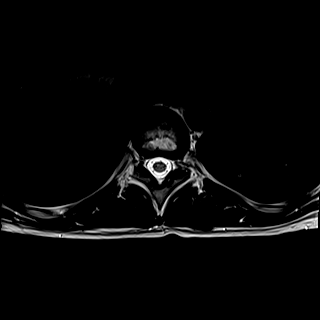
[im 16/39]
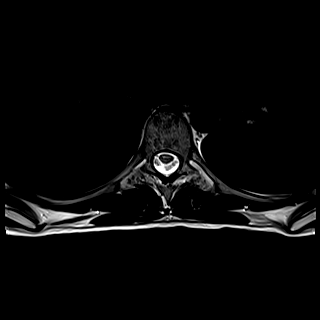
[im 23/39]
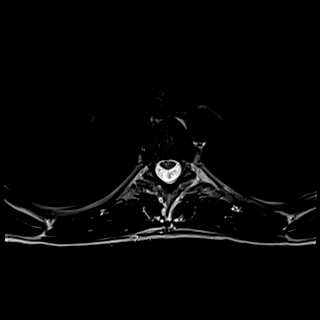
[im 31/39]
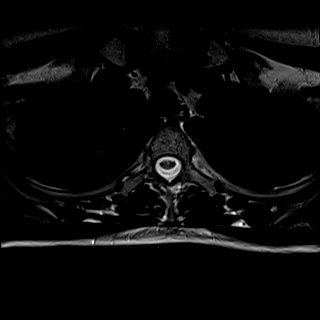
[im 39/39]
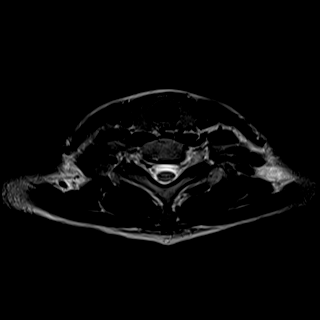

[Series 20: T1 · axial · non-contrast · 4.0mm · 0.30mm/px · z∈[-425,-214]mm · 6 of 39 slices shown (5 of 5)]
[im 1/39]
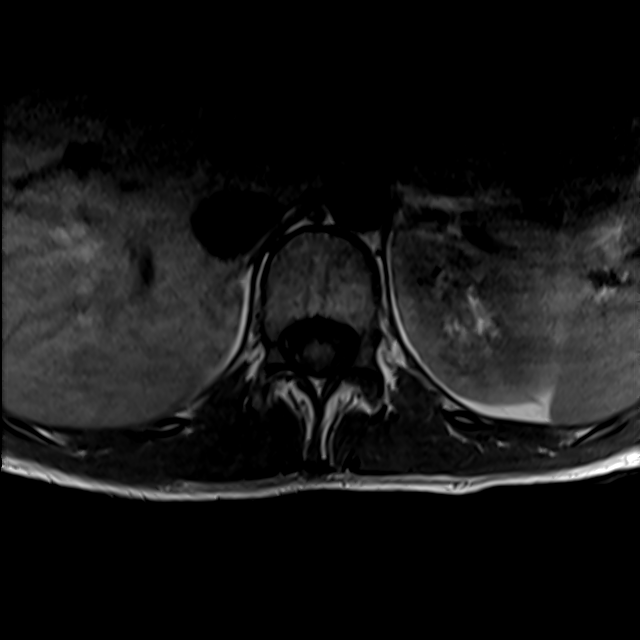
[im 8/39]
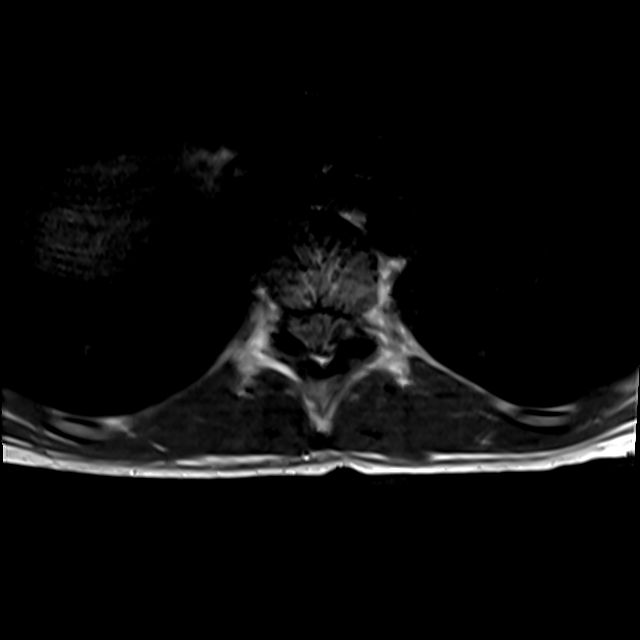
[im 16/39]
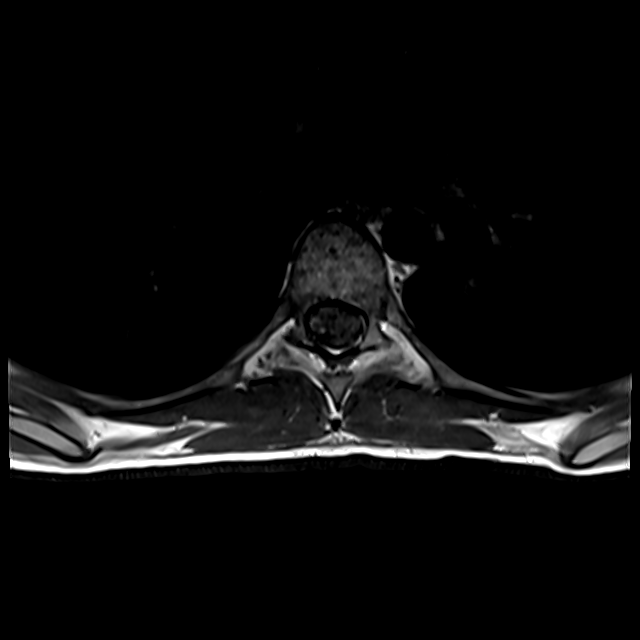
[im 23/39]
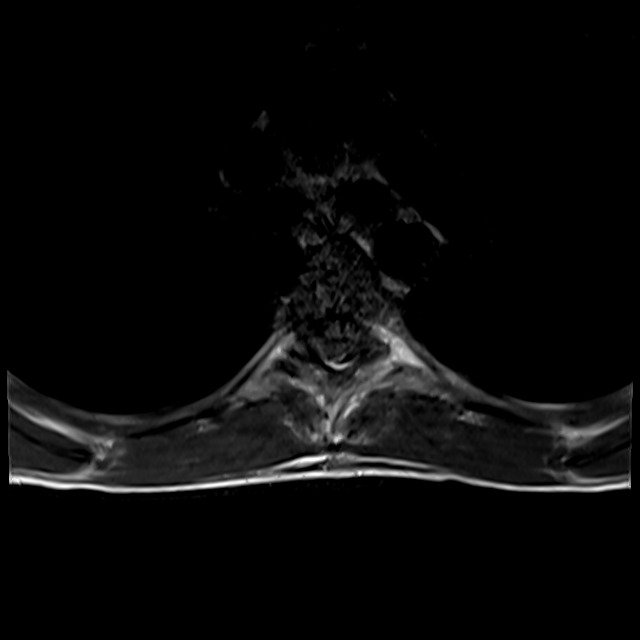
[im 31/39]
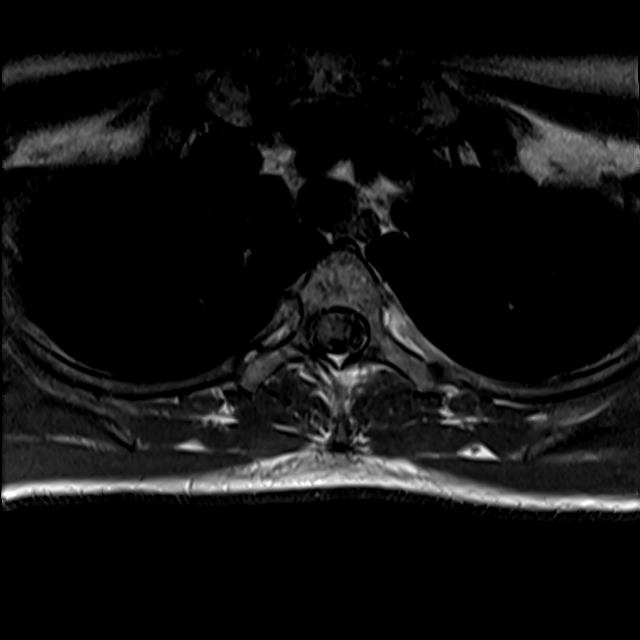
[im 39/39]
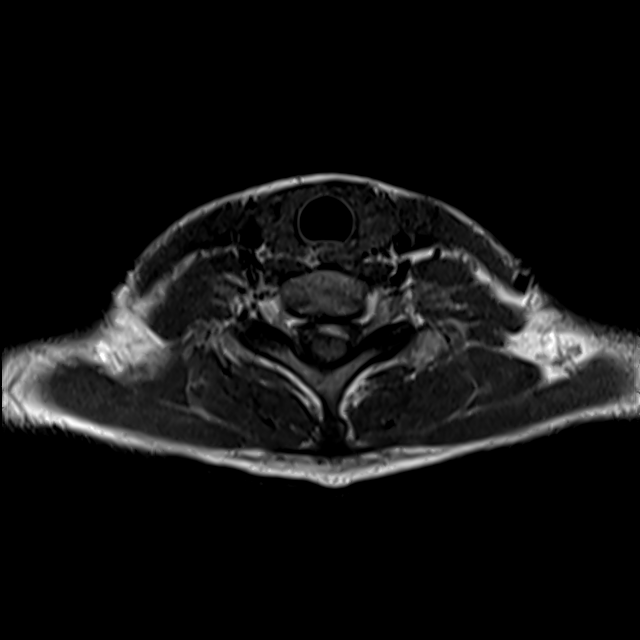

[Series 23: T1 fat-sat post-contrast · sagittal · 3.0mm · 0.80mm/px · 3 of 19 slices shown]
[im 1/19]
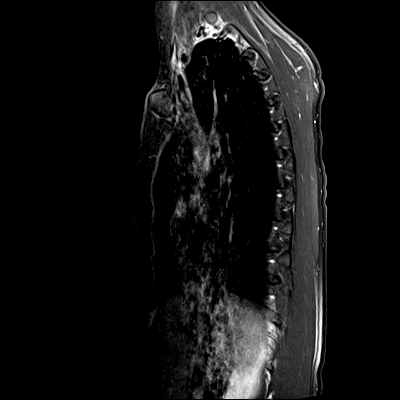
[im 10/19]
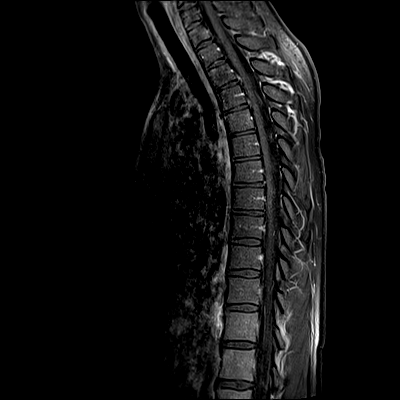
[im 19/19]
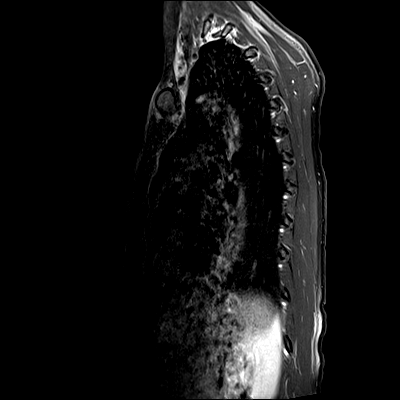

[25 of 48 positions shown; findings below may reference images not displayed]

FINDINGS: Alignment: Physiologic with preservation of the normal thoracic
kyphosis. No listhesis.

Vertebrae: Vertebral body height maintained without acute or chronic
fracture. Bone marrow signal intensity within normal limits. No
discrete or worrisome osseous lesions. No abnormal marrow edema or
enhancement.

Cord: Normal signal and morphology. No evidence for demyelinating
disease. No abnormal enhancement.

Paraspinal and other soft tissues: Unremarkable.

Disc levels:

T1-2: Unremarkable.

T2-3: Unremarkable.

T3-4: Left paracentral disc protrusion indents the left ventral
thecal sac (series 18, image 11). Secondary flattening of the left
ventral cord without cord signal changes or significant spinal
stenosis. Foramina remain patent.

T4-5: Small left paracentral disc protrusion indents the ventral
thecal sac (series 18, image 14). Mild flattening of the left hemi
cord without cord signal changes. No significant spinal stenosis.
Foramina remain patent.

T5-6: Tiny right central disc protrusion minimally indents the
ventral thecal sac without significant stenosis or cord deformity.
Foramina remain patent.

T6-7: Minimal disc bulge. No significant spinal stenosis. Foramina
remain patent.

T7-8: Minimal left eccentric disc bulge. Mild flattening of the
ventral thecal sac without significant spinal stenosis or cord
deformity. Foramina remain patent.

T8-9: Mild left eccentric disc bulge. No significant spinal stenosis
or cord deformity. Foramina remain patent.

T9-10: Unremarkable.

T10-11: Unremarkable.

T11-12: Unremarkable.

T12-L1: Unremarkable.
IMPRESSION: 1. Normal MRI appearance of the thoracic spinal cord. No evidence
for demyelinating disease.
2. Small left paracentral disc protrusions at T3-4 and T4-5 with
mild flattening of the left hemi cord, but no cord signal changes.
Findings could contribute to left-sided symptoms.
3. Additional minor spondylosis at T5-6 through T8-9 without
significant stenosis or impingement.

## 2022-01-12 IMAGING — MR MR CERVICAL SPINE WO/W CM
1 series · 48 of 48 positions shown · IV contrast (gadavist)
Comparison: None.

CLINICAL DATA: Initial evaluation for left-sided arm and leg
weakness for 2 days.

EXAM:
MRI CERVICAL SPINE WITHOUT AND WITH CONTRAST
TECHNIQUE: Multiplanar and multiecho pulse sequences of the cervical spine, to
include the craniocervical junction and cervicothoracic junction,
were obtained without and with intravenous contrast.
CONTRAST:  5mL GADAVIST GADOBUTROL 1 MMOL/ML IV SOLN

[Series 1: T1 · axial · non-contrast · 3.0mm · 0.94mm/px · z∈[-96,+68]mm · 48 of 56 slices shown]
[im 1/56]
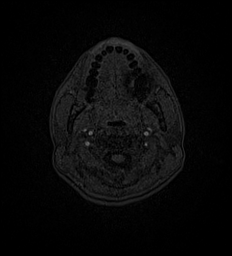
[im 2/56]
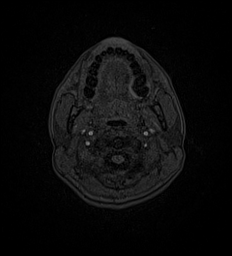
[im 3/56]
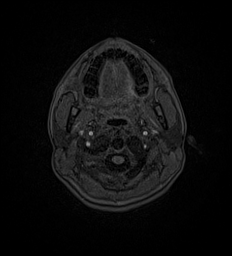
[im 4/56]
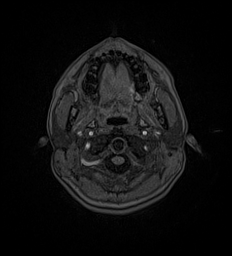
[im 5/56]
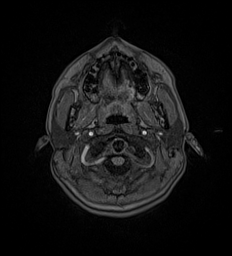
[im 6/56]
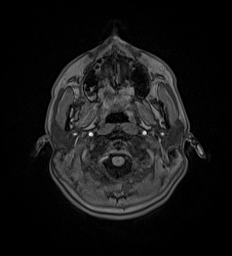
[im 8/56]
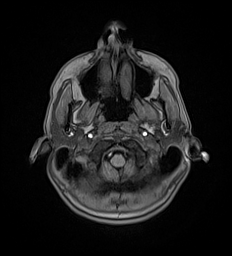
[im 9/56]
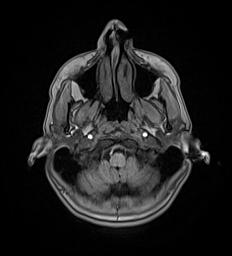
[im 10/56]
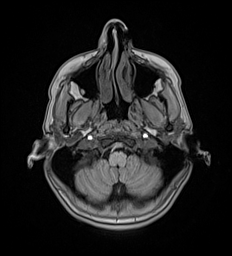
[im 11/56]
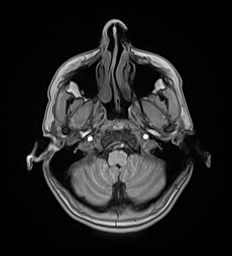
[im 12/56]
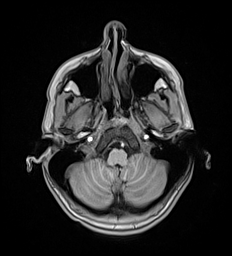
[im 13/56]
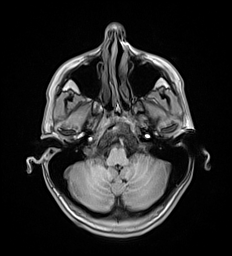
[im 15/56]
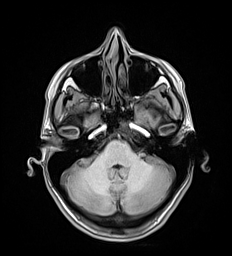
[im 16/56]
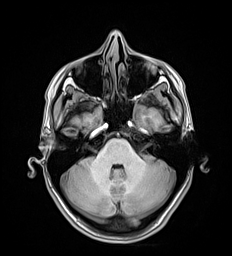
[im 17/56]
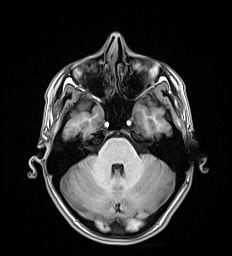
[im 18/56]
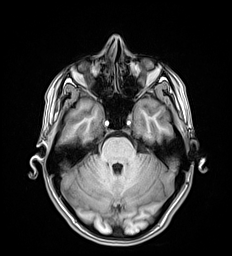
[im 19/56]
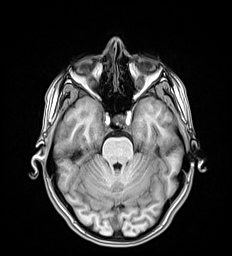
[im 20/56]
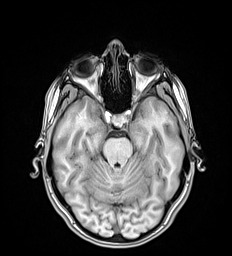
[im 22/56]
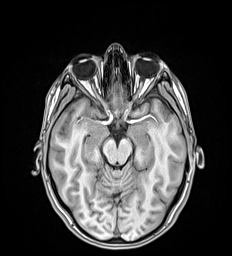
[im 23/56]
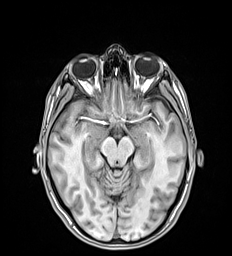
[im 24/56]
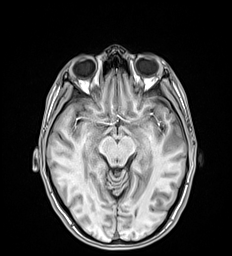
[im 25/56]
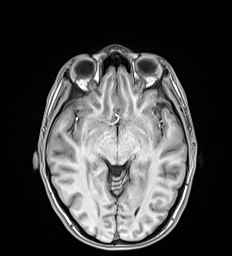
[im 26/56]
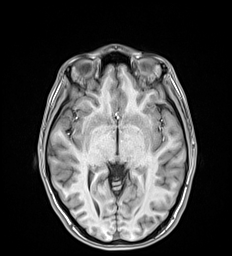
[im 27/56]
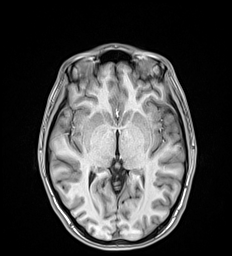
[im 29/56]
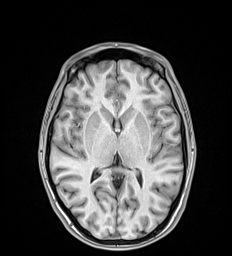
[im 30/56]
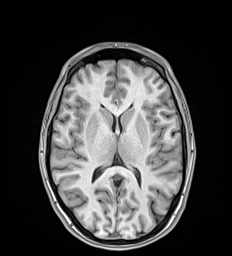
[im 31/56]
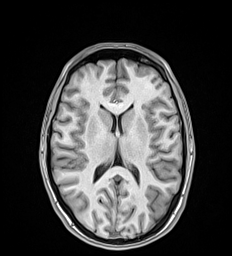
[im 32/56]
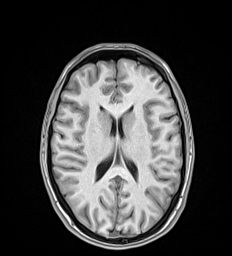
[im 33/56]
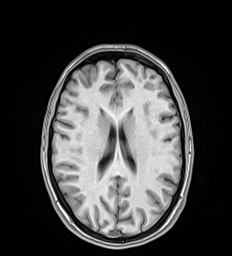
[im 34/56]
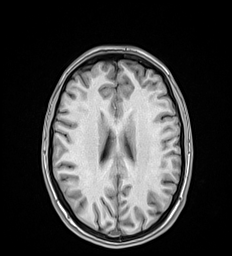
[im 36/56]
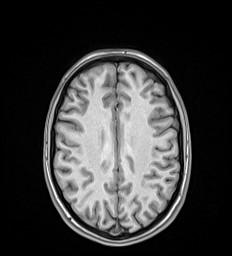
[im 37/56]
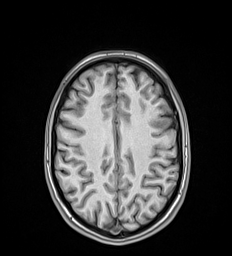
[im 38/56]
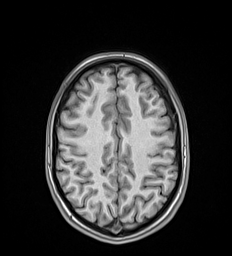
[im 39/56]
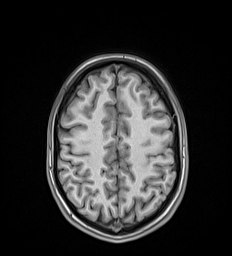
[im 40/56]
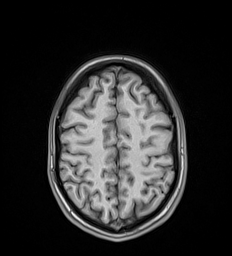
[im 41/56]
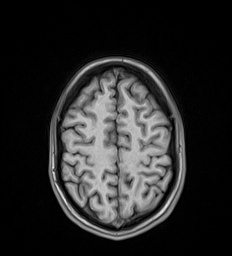
[im 43/56]
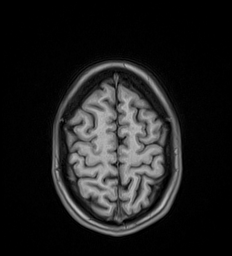
[im 44/56]
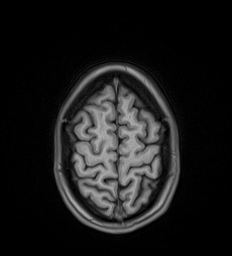
[im 45/56]
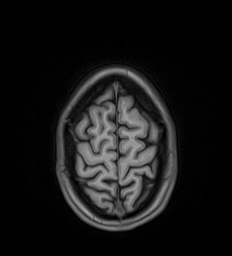
[im 46/56]
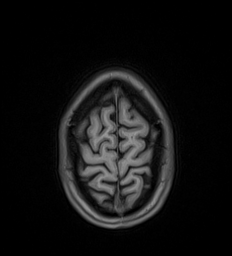
[im 47/56]
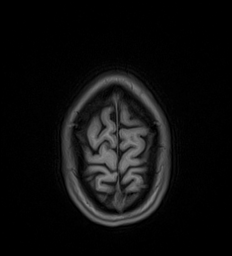
[im 48/56]
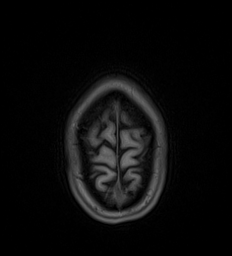
[im 50/56]
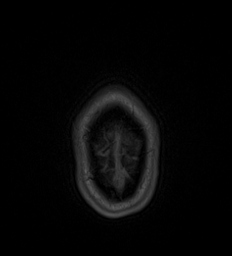
[im 51/56]
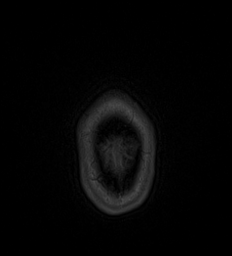
[im 52/56]
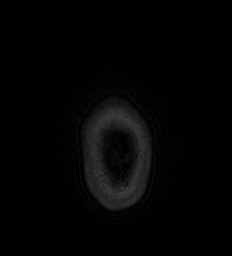
[im 53/56]
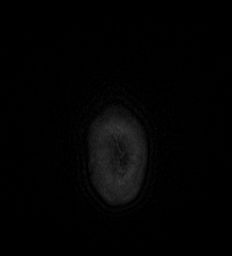
[im 54/56]
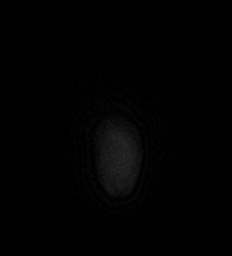
[im 56/56]
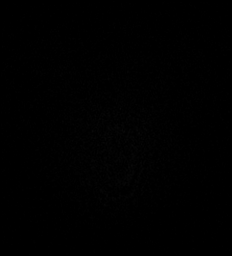

[48 of 48 positions shown; findings below may reference images not displayed]

FINDINGS: Alignment: Straightening of the normal cervical lordosis. No
listhesis.

Vertebrae: Vertebral body height maintained without acute or chronic
fracture. Bone marrow signal intensity within normal limits. No
discrete or worrisome osseous lesions. No abnormal marrow edema or
enhancement.

Cord: Normal signal morphology. No cord signal changes to suggest
demyelinating disease. No abnormal enhancement.

Posterior Fossa, vertebral arteries, paraspinal tissues:
Unremarkable.

Disc levels:

No significant disc pathology seen within the cervical spine. No
disc bulge or focal disc herniation. No significant facet pathology.
No canal or neural foraminal stenosis or evidence for neural
impingement.
IMPRESSION: Normal MRI of the cervical spine and spinal cord. No evidence for
demyelinating disease. No significant disc pathology, stenosis, or
evidence for neural impingement.

## 2022-01-12 IMAGING — MR MR HEAD WO/W CM
16 of 20 series · 31 of 48 positions shown · IV contrast (gadavist)
Comparison: None available.

CLINICAL DATA: Initial evaluation for left-sided numbness for 2
days.

EXAM:
MRI HEAD WITHOUT AND WITH CONTRAST
TECHNIQUE: Multiplanar, multiecho pulse sequences of the brain and surrounding
structures were obtained without and with intravenous contrast.
CONTRAST:  5mL GADAVIST GADOBUTROL 1 MMOL/ML IV SOLN

[Series 5: DWI · axial · 3.0mm · 0.92mm/px · z∈[-99,+65]mm · 2 of 112 slices shown (1 of 4)]
[im 1/112]
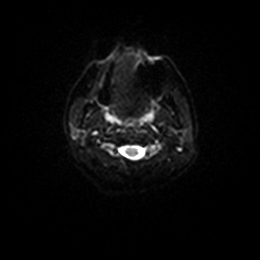
[im 112/112]
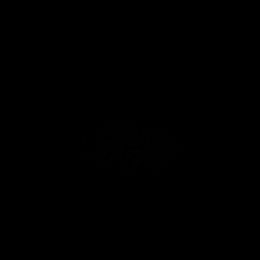

[Series 6: DWI · axial · 3.0mm · 0.92mm/px · 1 of 55 slices shown (2 of 4)]
[im 1/55]
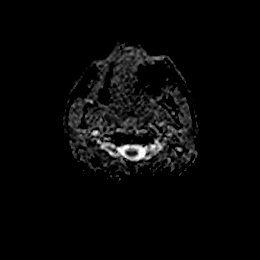

[Series 7: DWI · coronal · 4.0mm · 0.88mm/px · 2 of 82 slices shown (3 of 4)]
[im 1/82]
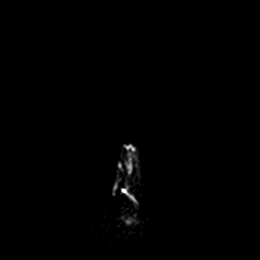
[im 82/82]
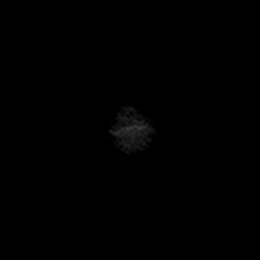

[Series 8: DWI · coronal · 4.0mm · 0.88mm/px · 1 of 41 slices shown (4 of 4)]
[im 1/41]
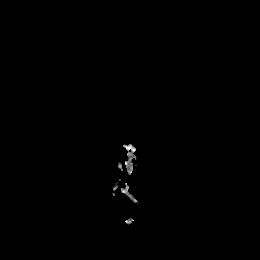

[Series 9: T1 · sagittal · 5.0mm · 0.75mm/px · 1 of 27 slices shown (1 of 2)]
[im 1/27]
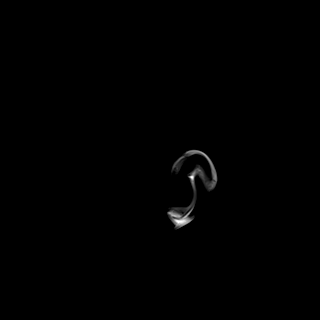

[Series 10: T2 · axial · 5.0mm · 0.75mm/px · 1 of 28 slices shown]
[im 1/28]
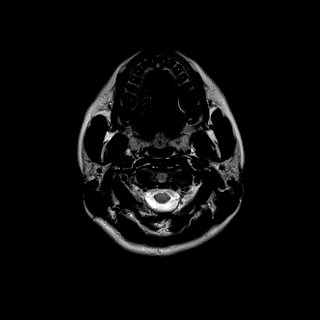

[Series 11: FLAIR · axial · 5.0mm · 0.47mm/px · 1 of 28 slices shown]
[im 1/28]
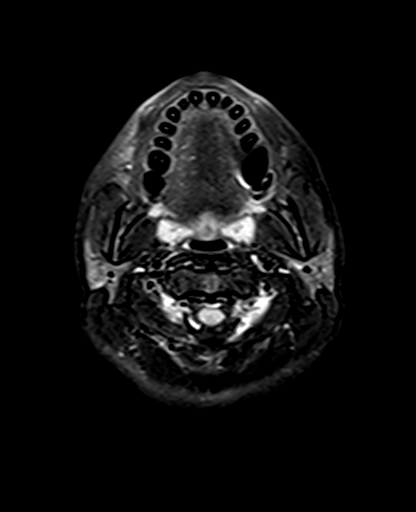

[Series 12: mag_images · axial · 3.0mm · 0.94mm/px · z∈[-95,+69]mm · 2 of 56 slices shown]
[im 1/56]
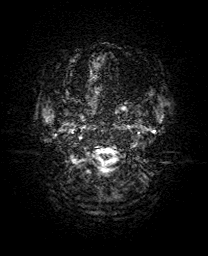
[im 56/56]
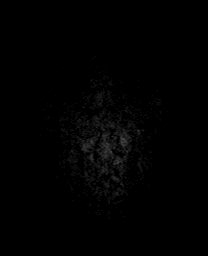

[Series 13: pha_images · axial · 3.0mm · 0.94mm/px · z∈[-95,+57]mm · 2 of 52 slices shown]
[im 1/52]
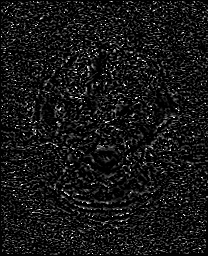
[im 52/52]
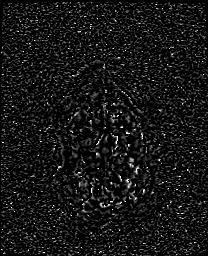

[Series 14: swi_images · axial · 3.0mm · 0.94mm/px · z∈[-95,+69]mm · 2 of 56 slices shown]
[im 1/56]
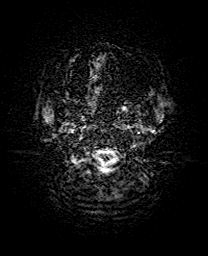
[im 56/56]
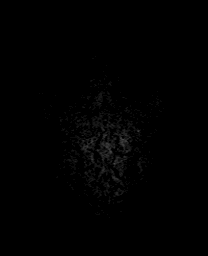

[Series 15: mip_images(sw) · axial · 24.0mm · 0.94mm/px · z∈[-85,+58]mm · 2 of 49 slices shown]
[im 1/49]
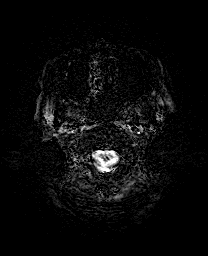
[im 49/49]
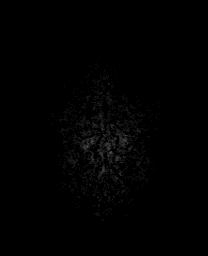

[Series 16: t2_space_dark-fluid_sag_p2_ns-ir · sagittal · 1.0mm · 0.49mm/px · 4 of 192 slices shown]
[im 1/192]
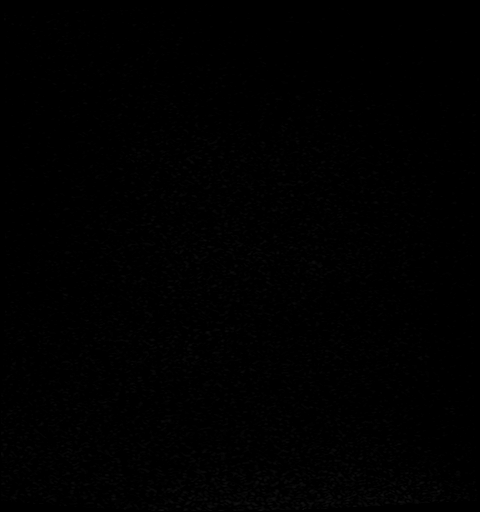
[im 39/192]
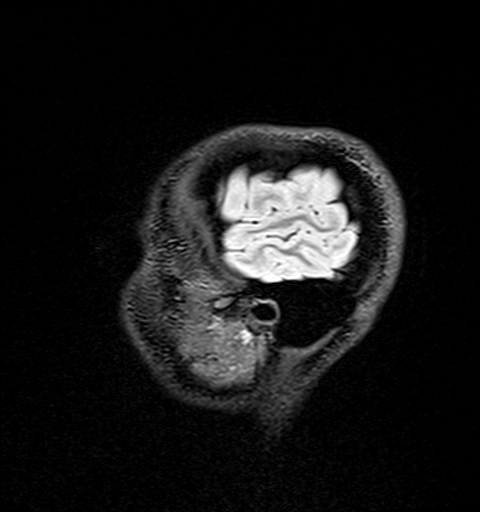
[im 77/192]
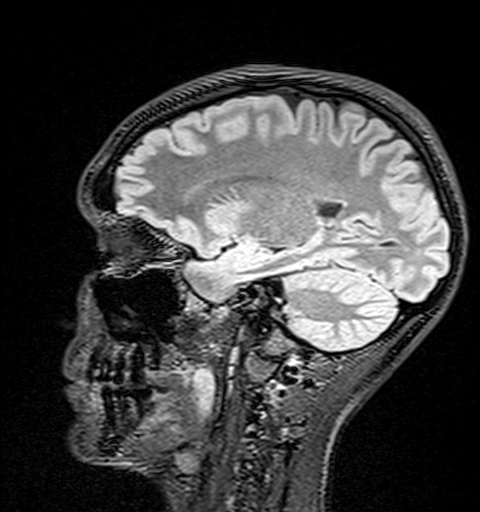
[im 115/192]
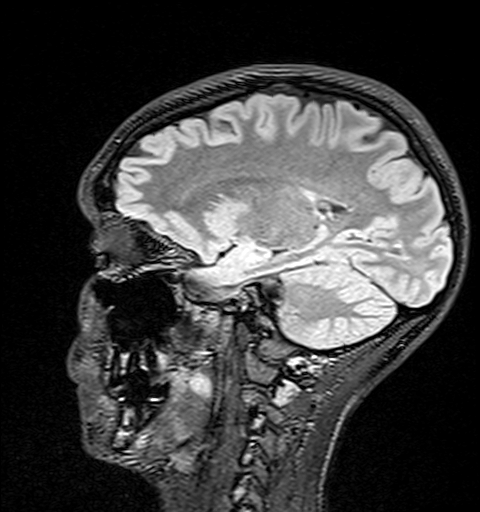

[Series 19: T2 post-contrast · coronal · 5.0mm · 0.72mm/px · 1 of 34 slices shown]
[im 1/34]
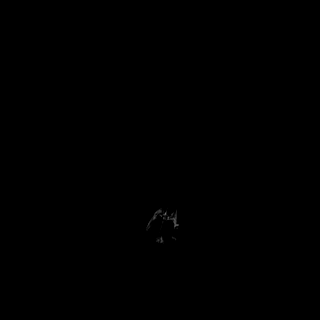

[Series 21: T1 post-contrast · coronal · 5.0mm · 0.34mm/px · 1 of 34 slices shown (1 of 2)]
[im 1/34]
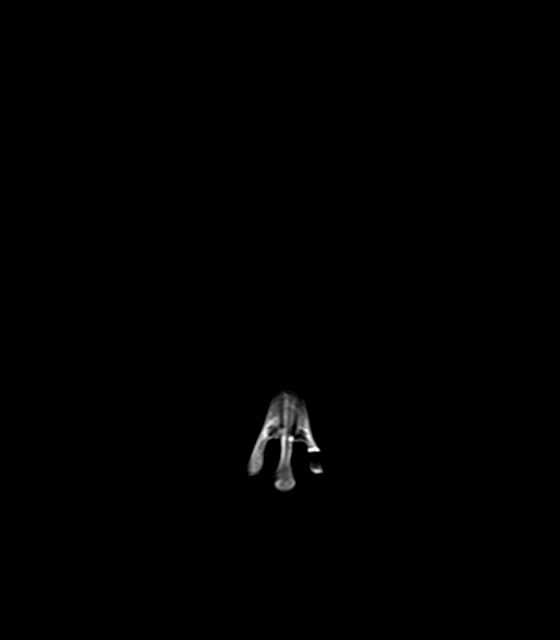

[Series 22: T1 post-contrast · sagittal · 5.0mm · 0.75mm/px · 1 of 28 slices shown (2 of 2)]
[im 1/28]
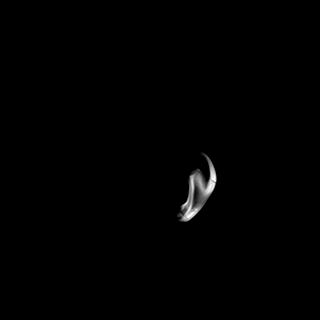

[Series 1005: T1 · axial · 0.8mm · 0.45mm/px · z∈[-77,+87]mm · 7 of 216 slices shown (2 of 2)]
[im 1/216]
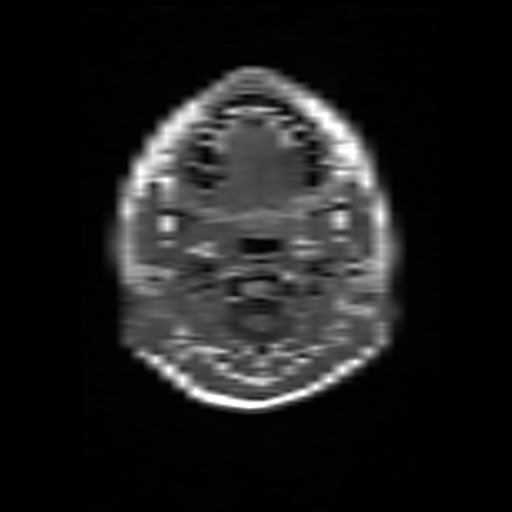
[im 36/216]
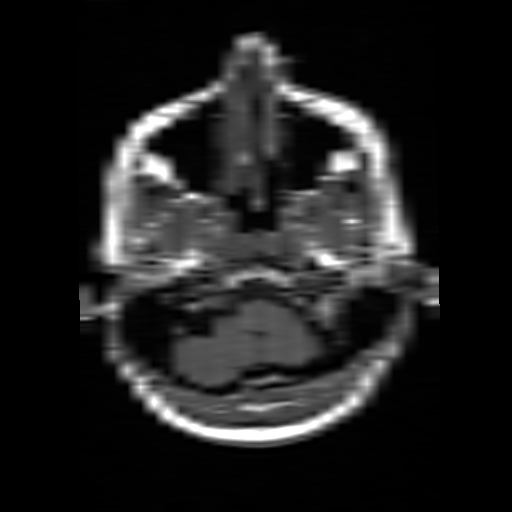
[im 72/216]
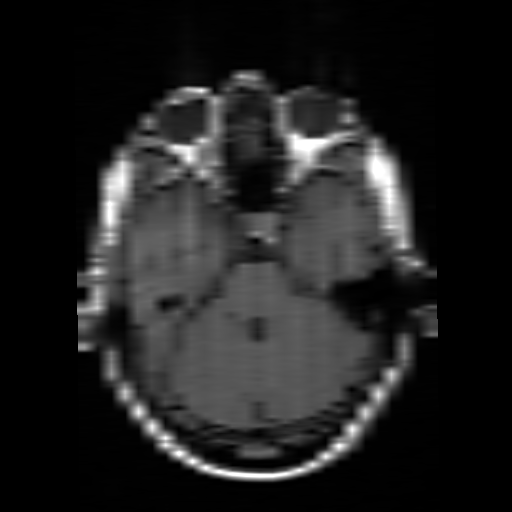
[im 108/216]
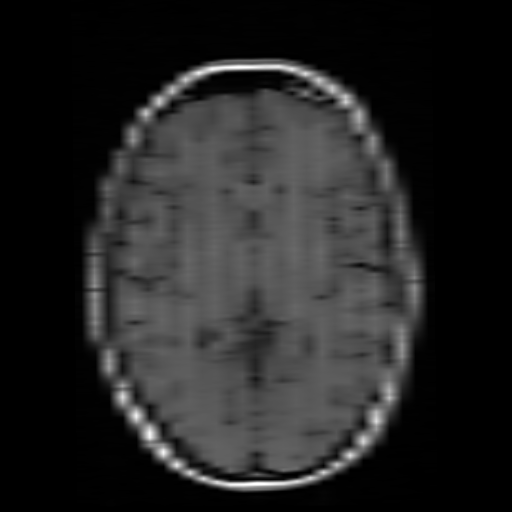
[im 144/216]
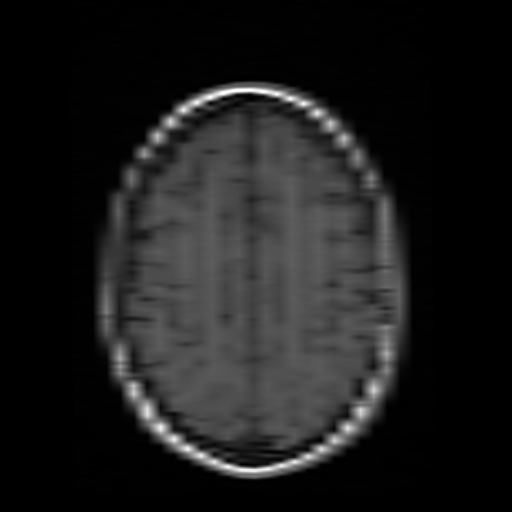
[im 180/216]
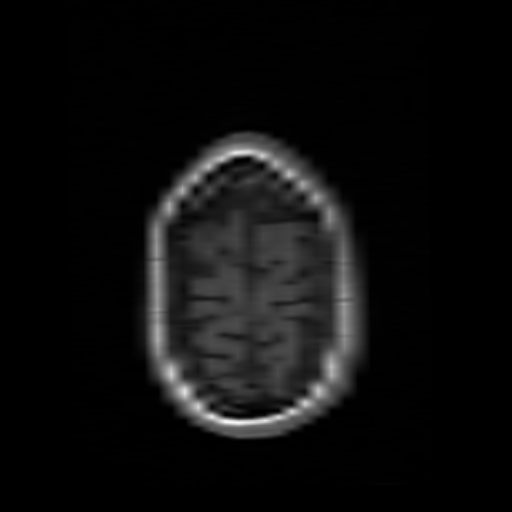
[im 216/216]
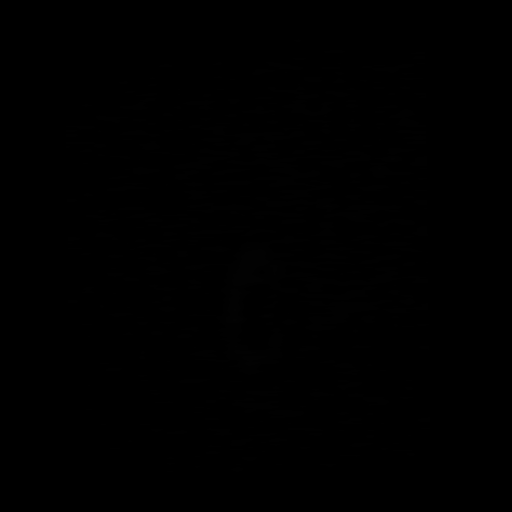

[31 of 48 positions shown; findings below may reference images not displayed]

FINDINGS: Brain: Cerebral volume within normal limits for patient age. No
focal parenchymal signal abnormality identified.

No abnormal foci of restricted diffusion to suggest acute or
subacute ischemia. Gray-white matter differentiation well
maintained. No encephalomalacia to suggest chronic infarction. No
foci of susceptibility artifact to suggest acute or chronic
intracranial hemorrhage.

No mass lesion, midline shift or mass effect. No hydrocephalus. No
extra-axial fluid collection.

Pituitary gland and suprasellar region are normal. Midline
structures intact and normal.

No abnormal enhancement.

Vascular: Major intracranial vascular flow voids well maintained.

Skull and upper cervical spine: Craniocervical junction normal.
Visualized upper cervical spine within normal limits. Bone marrow
signal intensity normal. No scalp soft tissue abnormality.

Sinuses/Orbits: Globes and orbital soft tissues within normal
limits.

Paranasal sinuses are largely clear. No mastoid effusion. Inner ear
structures grossly normal.

Other: None.
IMPRESSION: Normal brain MRI. No evidence for demyelinating disease or other
acute abnormality.

## 2022-01-12 MED ORDER — GADOBUTROL 1 MMOL/ML IV SOLN
5.0000 mL | Freq: Once | INTRAVENOUS | Status: AC | PRN
  Administered 2022-01-12: 5 mL via INTRAVENOUS

## 2022-01-12 MED ORDER — METHOCARBAMOL 500 MG PO TABS: 500.0000 mg | ORAL_TABLET | Freq: Three times a day (TID) | ORAL | 0 refills | Status: DC | PRN

## 2022-01-12 MED ORDER — LIDOCAINE 5 % EX PTCH: 1.0000 | MEDICATED_PATCH | Freq: Every day | CUTANEOUS | 0 refills | Status: DC | PRN

## 2022-01-12 MED ORDER — PREDNISONE 10 MG (21) PO TBPK: ORAL_TABLET | Freq: Every day | ORAL | 0 refills | Status: DC

## 2022-01-12 MED ORDER — KETOROLAC TROMETHAMINE 15 MG/ML IJ SOLN
15.0000 mg | Freq: Once | INTRAMUSCULAR | Status: AC
Start: 2022-01-12 — End: 2022-01-12
  Administered 2022-01-12: 15 mg via INTRAVENOUS
  Filled 2022-01-12: qty 1

## 2022-01-12 NOTE — ED Notes (Signed)
The pt just returned from mri 

## 2022-01-12 NOTE — Discharge Instructions (Addendum)
You were seen in the emergency department today for back pain with left upper and lower extremity pain and numbness/weakness.  Your MRIs of the brain and cervical spine (neck) were reassuring.  Your thoracic spine MRI (mid back) showed that you do have some disc protrusion as well as some degenerative changes which may be contributing to your symptoms. ? ?We are sending you home with a steroid taper to treat for possible nerve inflammation as well as muscle relaxants.   ? ?-Prednisone: This is a steroid to help with pain and swelling. Be sure to take this medication as prescribed with food in the morning, it should be taken with food, as it can cause stomach upset, and more seriously, stomach bleeding.  ? ?- Robaxin- this is the muscle relaxer I have prescribed, this is meant to help with muscle tightness/spasms. Be aware that this medication may make you drowsy therefore the first time you take this it should be at a time you are in an environment where you can rest. Do not drive or operate heavy machinery when taking this medication. Do not drink alcohol or take other sedating medications with this medicine such as narcotics or benzodiazepines.  ? ?- Lidoderm patch- Apply 1 patch to your area of most significant pain once per day to help numb/soothe this area. Remove & discard patch within 12 hours of application.  Do not apply heat over the patch. ? ?You make take Tylenol per over the counter dosing with these medications.  ? ?We have prescribed you new medication(s) today. Discuss the medications prescribed today with your pharmacist as they can have adverse effects and interactions with your other medicines including over the counter and prescribed medications. Seek medical evaluation if you start to experience new or abnormal symptoms after taking one of these medicines, seek care immediately if you start to experience difficulty breathing, feeling of your throat closing, facial swelling, or rash as these  could be indications of a more serious allergic reaction ? ? ?Please follow-up with primary care as well as neurology, we have also provided information for neurosurgery.  Return to the emergency department for new or worsening symptoms including but not limited to new or worsening pain, progressing numbness/weakness, passing out, also control bowel or bladder function, inability to walk, or any other concerns. ? ? ? ? ? ?

## 2022-01-12 NOTE — ED Notes (Signed)
The pt was sent here for a mri for numbness in her lt arm and leg for 2 days getting worse  no known injuries ?

## 2022-01-12 NOTE — ED Provider Notes (Signed)
Assumed care of patient in transfer from [facility] emergency department for MRIs ? ?01:30: Initial attempt to evaluate patient- currently in MRI ? ?Patient initially presented to [facility] emergency department with complaints of midthoracic back pain with left proximal upper and lower extremity pain and paresthesias. ? ?Case was discussed with neurology who recommended transfer for MRI brain, C-spine, and T-spine with and without contrast. ? ?MRI brain:  Normal brain MRI. No evidence for demyelinating disease or other acute abnormality ?MRI C spine: Normal MRI of the cervical spine and spinal cord. No evidence for demyelinating disease. No significant disc pathology, stenosis, or evidence for neural impingement. ?MRI T spine: 1. Normal MRI appearance of the thoracic spinal cord. No evidence for demyelinating disease. 2. Small left paracentral disc protrusions at T3-4 and T4-5 with mild flattening of the left hemi cord, but no cord signal changes. Findings could contribute to left-sided symptoms. 3. Additional minor spondylosis at T5-6 through T8-9 without significant stenosis or impingement.  ? ?MRIs without findings of demyelinating disease which is reassuring.  Patient does have some disc protrusion within thoracic region which may be contributing to her symptoms.  She has no critical electrolyte derangement or anemia.  I have ordered a pregnancy test which is negative. ? ?On my exam she does report decreased sensation to the left upper and lower extremity in certain regions.  Has some difficulty lifting her left lower extremity off of the stretcher compared to her right but is able to do so against resistance, has good grip strength.  She is ambulatory. ? ?With reassuring MRIs feel she is reasonable for discharge home with treatment for possible radiculopathy and neurology/neurosurgery follow-up.  Discussed with supervising physician Dr. Roxanne Mins who is in agreement.  ? ?I discussed results, treatment plan, need  for follow-up, and return precautions with the patient. Provided opportunity for questions, patient confirmed understanding and is in agreement with plan.  ? ? ?Results for orders placed or performed during the hospital encounter of 01/11/22  ?Basic metabolic panel  ?Result Value Ref Range  ? Sodium 139 135 - 145 mmol/L  ? Potassium 3.7 3.5 - 5.1 mmol/L  ? Chloride 110 98 - 111 mmol/L  ? CO2 25 22 - 32 mmol/L  ? Glucose, Bld 87 70 - 99 mg/dL  ? BUN 10 6 - 20 mg/dL  ? Creatinine, Ser 0.53 0.44 - 1.00 mg/dL  ? Calcium 8.5 (L) 8.9 - 10.3 mg/dL  ? GFR, Estimated >60 >60 mL/min  ? Anion gap 4 (L) 5 - 15  ?CBC with Differential  ?Result Value Ref Range  ? WBC 10.6 (H) 4.0 - 10.5 K/uL  ? RBC 4.30 3.87 - 5.11 MIL/uL  ? Hemoglobin 13.1 12.0 - 15.0 g/dL  ? HCT 39.7 36.0 - 46.0 %  ? MCV 92.3 80.0 - 100.0 fL  ? MCH 30.5 26.0 - 34.0 pg  ? MCHC 33.0 30.0 - 36.0 g/dL  ? RDW 13.7 11.5 - 15.5 %  ? Platelets 354 150 - 400 K/uL  ? nRBC 0.0 0.0 - 0.2 %  ? Neutrophils Relative % 54 %  ? Neutro Abs 5.8 1.7 - 7.7 K/uL  ? Lymphocytes Relative 35 %  ? Lymphs Abs 3.7 0.7 - 4.0 K/uL  ? Monocytes Relative 8 %  ? Monocytes Absolute 0.8 0.1 - 1.0 K/uL  ? Eosinophils Relative 2 %  ? Eosinophils Absolute 0.3 0.0 - 0.5 K/uL  ? Basophils Relative 1 %  ? Basophils Absolute 0.1 0.0 - 0.1  K/uL  ? Immature Granulocytes 0 %  ? Abs Immature Granulocytes 0.02 0.00 - 0.07 K/uL  ?I-Stat beta hCG blood, ED  ?Result Value Ref Range  ? I-stat hCG, quantitative <5.0 <5 mIU/mL  ? Comment 3          ? ?MR Brain W and Wo Contrast ? ?Result Date: 01/12/2022 ?CLINICAL DATA:  Initial evaluation for left-sided numbness for 2 days. EXAM: MRI HEAD WITHOUT AND WITH CONTRAST TECHNIQUE: Multiplanar, multiecho pulse sequences of the brain and surrounding structures were obtained without and with intravenous contrast. CONTRAST:  63mL GADAVIST GADOBUTROL 1 MMOL/ML IV SOLN COMPARISON:  None available. FINDINGS: Brain: Cerebral volume within normal limits for patient age. No focal  parenchymal signal abnormality identified. No abnormal foci of restricted diffusion to suggest acute or subacute ischemia. Gray-white matter differentiation well maintained. No encephalomalacia to suggest chronic infarction. No foci of susceptibility artifact to suggest acute or chronic intracranial hemorrhage. No mass lesion, midline shift or mass effect. No hydrocephalus. No extra-axial fluid collection. Pituitary gland and suprasellar region are normal. Midline structures intact and normal. No abnormal enhancement. Vascular: Major intracranial vascular flow voids well maintained. Skull and upper cervical spine: Craniocervical junction normal. Visualized upper cervical spine within normal limits. Bone marrow signal intensity normal. No scalp soft tissue abnormality. Sinuses/Orbits: Globes and orbital soft tissues within normal limits. Paranasal sinuses are largely clear. No mastoid effusion. Inner ear structures grossly normal. Other: None. IMPRESSION: Normal brain MRI. No evidence for demyelinating disease or other acute abnormality. Electronically Signed   By: Jeannine Boga M.D.   On: 01/12/2022 04:33  ? ?MR Cervical Spine W or Wo Contrast ? ?Result Date: 01/12/2022 ?CLINICAL DATA:  Initial evaluation for left-sided arm and leg weakness for 2 days. EXAM: MRI CERVICAL SPINE WITHOUT AND WITH CONTRAST TECHNIQUE: Multiplanar and multiecho pulse sequences of the cervical spine, to include the craniocervical junction and cervicothoracic junction, were obtained without and with intravenous contrast. CONTRAST:  66mL GADAVIST GADOBUTROL 1 MMOL/ML IV SOLN COMPARISON:  None. FINDINGS: Alignment: Straightening of the normal cervical lordosis. No listhesis. Vertebrae: Vertebral body height maintained without acute or chronic fracture. Bone marrow signal intensity within normal limits. No discrete or worrisome osseous lesions. No abnormal marrow edema or enhancement. Cord: Normal signal morphology. No cord signal changes  to suggest demyelinating disease. No abnormal enhancement. Posterior Fossa, vertebral arteries, paraspinal tissues: Unremarkable. Disc levels: No significant disc pathology seen within the cervical spine. No disc bulge or focal disc herniation. No significant facet pathology. No canal or neural foraminal stenosis or evidence for neural impingement. IMPRESSION: Normal MRI of the cervical spine and spinal cord. No evidence for demyelinating disease. No significant disc pathology, stenosis, or evidence for neural impingement. Electronically Signed   By: Jeannine Boga M.D.   On: 01/12/2022 04:39  ? ?MR THORACIC SPINE W WO CONTRAST ? ?Result Date: 01/12/2022 ?CLINICAL DATA:  Initial evaluation for left arm and leg weakness for 2 days. EXAM: MRI THORACIC WITHOUT AND WITH CONTRAST TECHNIQUE: Multiplanar and multiecho pulse sequences of the thoracic spine were obtained without and with intravenous contrast. CONTRAST:  58mL GADAVIST GADOBUTROL 1 MMOL/ML IV SOLN COMPARISON:  None. FINDINGS: Alignment: Physiologic with preservation of the normal thoracic kyphosis. No listhesis. Vertebrae: Vertebral body height maintained without acute or chronic fracture. Bone marrow signal intensity within normal limits. No discrete or worrisome osseous lesions. No abnormal marrow edema or enhancement. Cord: Normal signal and morphology. No evidence for demyelinating disease. No abnormal enhancement. Paraspinal and other soft  tissues: Unremarkable. Disc levels: T1-2: Unremarkable. T2-3: Unremarkable. T3-4: Left paracentral disc protrusion indents the left ventral thecal sac (series 18, image 11). Secondary flattening of the left ventral cord without cord signal changes or significant spinal stenosis. Foramina remain patent. T4-5: Small left paracentral disc protrusion indents the ventral thecal sac (series 18, image 14). Mild flattening of the left hemi cord without cord signal changes. No significant spinal stenosis. Foramina remain  patent. T5-6: Tiny right central disc protrusion minimally indents the ventral thecal sac without significant stenosis or cord deformity. Foramina remain patent. T6-7: Minimal disc bulge. No significant

## 2022-06-14 ENCOUNTER — Encounter (HOSPITAL_COMMUNITY): Payer: Self-pay | Admitting: *Deleted

## 2022-06-14 ENCOUNTER — Emergency Department (HOSPITAL_COMMUNITY)
Admission: EM | Admit: 2022-06-14 | Discharge: 2022-06-14 | Disposition: A | Discharge status: Home / Self Care | Payer: Self-pay | Attending: Emergency Medicine | Admitting: Emergency Medicine

## 2022-06-14 ENCOUNTER — Other Ambulatory Visit: Payer: Self-pay

## 2022-06-14 DIAGNOSIS — J029 Acute pharyngitis, unspecified: Secondary | ICD-10-CM | POA: Insufficient documentation

## 2022-06-14 DIAGNOSIS — J069 Acute upper respiratory infection, unspecified: Secondary | ICD-10-CM | POA: Insufficient documentation

## 2022-06-14 DIAGNOSIS — H53149 Visual discomfort, unspecified: Secondary | ICD-10-CM | POA: Insufficient documentation

## 2022-06-14 DIAGNOSIS — Z20822 Contact with and (suspected) exposure to covid-19: Secondary | ICD-10-CM | POA: Insufficient documentation

## 2022-06-14 DIAGNOSIS — Z7982 Long term (current) use of aspirin: Secondary | ICD-10-CM | POA: Insufficient documentation

## 2022-06-14 DIAGNOSIS — R197 Diarrhea, unspecified: Secondary | ICD-10-CM | POA: Insufficient documentation

## 2022-06-14 DIAGNOSIS — Z79899 Other long term (current) drug therapy: Secondary | ICD-10-CM | POA: Insufficient documentation

## 2022-06-14 DIAGNOSIS — R110 Nausea: Secondary | ICD-10-CM | POA: Insufficient documentation

## 2022-06-14 DIAGNOSIS — R519 Headache, unspecified: Secondary | ICD-10-CM | POA: Insufficient documentation

## 2022-06-14 DIAGNOSIS — R0981 Nasal congestion: Secondary | ICD-10-CM | POA: Insufficient documentation

## 2022-06-14 LAB — SARS CORONAVIRUS 2 BY RT PCR: SARS Coronavirus 2 by RT PCR: NEGATIVE

## 2022-06-14 LAB — GROUP A STREP BY PCR: Group A Strep by PCR: NOT DETECTED

## 2022-06-14 MED ORDER — KETOROLAC TROMETHAMINE 30 MG/ML IJ SOLN
30.0000 mg | Freq: Once | INTRAMUSCULAR | Status: AC
  Administered 2022-06-14: 30 mg via INTRAVENOUS
  Filled 2022-06-14: qty 1

## 2022-06-14 MED ORDER — METOCLOPRAMIDE HCL 5 MG/ML IJ SOLN
10.0000 mg | Freq: Once | INTRAMUSCULAR | Status: AC
  Administered 2022-06-14: 10 mg via INTRAVENOUS
  Filled 2022-06-14: qty 2

## 2022-06-14 MED ORDER — SODIUM CHLORIDE 0.9 % IV BOLUS
500.0000 mL | Freq: Once | INTRAVENOUS | Status: AC
  Administered 2022-06-14: 500 mL via INTRAVENOUS

## 2022-06-14 NOTE — ED Triage Notes (Addendum)
Pt c/o headache, sinus pressure, nausea and diarrhea, sore throat x 3 days. Pt's niece who lives with her tested positive for strep throat today.

## 2022-06-14 NOTE — Discharge Instructions (Signed)
You were seen in the emergency department for headache sore throat nausea diarrhea.  Your strep and COVID test were negative.  Your symptoms improved with some fluids and medication.  Please drink plenty of fluids and rest.  Tylenol and ibuprofen for pain.  Follow-up with your regular doctor.  Return to the emergency department if any worsening or concerning symptoms

## 2022-06-14 NOTE — ED Provider Notes (Signed)
[facility] EMERGENCY DEPARTMENT Provider Note   CSN: [id number] Arrival date & time: 06/14/22  1658     History {Add pertinent medical, surgical, social history, OB history to HPI:1} Chief Complaint  Patient presents with   Headache    Sheila Lyons is a 26 y.o. female.  She is here with complaint of occipital headache sinus pressure 8 out of 10 intensity worse with movement and light, associated with nausea and diarrhea, sore throat.  She did have a close contact with someone with strep throat.  No known fevers.  She did a home COVID test that was negative.  No recent travel.  She has tried Excedrin without improvement.  No prior history of migraines.  Finished her period.  Denies chance of pregnancy.  The history is provided by the patient.  Headache Pain location:  Occipital Quality:  Dull Radiates to:  Does not radiate Severity currently:  8/10 Severity at highest:  8/10 Onset quality:  Gradual Duration:  2 days Timing:  Constant Progression:  Unchanged Chronicity:  New Relieved by:  Nothing Worsened by:  Light and activity Ineffective treatments:  NSAIDs Associated symptoms: congestion, diarrhea, nausea, photophobia, sore throat and URI   Associated symptoms: no abdominal pain, no blurred vision, no cough, no eye pain, no fever, no neck pain and no vomiting        Home Medications Prior to Admission medications   Medication Sig Start Date End Date Taking? Authorizing Provider  Aspirin-Acetaminophen-Caffeine (GOODY HEADACHE PO) Take 1 Package by mouth 2 (two) times daily as needed (headache).    [provider]  CRANBERRY PO Take 1 capsule by mouth daily.    [provider]  ibuprofen (ADVIL) 200 MG tablet Take 400-600 mg by mouth every 6 (six) hours as needed for headache or moderate pain.    [provider]  lidocaine (LIDODERM) 5 % Place 1 patch onto the skin daily as needed. Apply patch to area most significant pain once per day.   Remove and discard patch within 12 hours of application. 01/12/22   Petrucelli, Samantha R, PA-C  methocarbamol (ROBAXIN) 500 MG tablet Take 1 tablet (500 mg total) by mouth every 8 (eight) hours as needed for muscle spasms. 01/12/22   Petrucelli, Samantha R, PA-C  predniSONE (STERAPRED UNI-PAK 21 TAB) 10 MG (21) TBPK tablet Take by mouth daily. 6, 5, 4, 3, 2, 1 take as written 01/12/22   Petrucelli, Samantha R, PA-C  Probiotic Product (PROBIOTIC DAILY PO) Take 1 capsule by mouth daily.    [provider]      Allergies    Penicillins, Sulfa antibiotics, and Keflex [cephalexin]    Review of Systems   Review of Systems  Constitutional:  Negative for fever.  HENT:  Positive for congestion and sore throat.   Eyes:  Positive for photophobia. Negative for blurred vision and pain.  Respiratory:  Negative for cough.   Gastrointestinal:  Positive for diarrhea and nausea. Negative for abdominal pain and vomiting.  Musculoskeletal:  Negative for neck pain.  Neurological:  Positive for headaches.    Physical Exam Updated Vital Signs BP 113/82   Pulse 85   Temp 98.1 F (36.7 C) (Oral)   Resp 18   Ht 5\' 6"  (1.676 m)   Wt 56.7 kg   LMP 06/09/2022   SpO2 100%   BMI 20.18 kg/m  Physical Exam Vitals and nursing note reviewed.  Constitutional:      General: She is not in acute distress.  Appearance: She is well-developed.  HENT:     Head: Normocephalic and atraumatic.     Right Ear: Tympanic membrane and ear canal normal.     Left Ear: Tympanic membrane and ear canal normal.  Eyes:     Extraocular Movements: Extraocular movements intact.     Conjunctiva/sclera: Conjunctivae normal.     Pupils: Pupils are equal, round, and reactive to light.  Cardiovascular:     Rate and Rhythm: Normal rate and regular rhythm.     Heart sounds: No murmur heard. Pulmonary:     Effort: Pulmonary effort is normal. No respiratory distress.     Breath sounds: Normal breath sounds.  Abdominal:      Palpations: Abdomen is soft.     Tenderness: There is no abdominal tenderness. There is no guarding or rebound.  Musculoskeletal:        General: Normal range of motion.     Cervical back: Neck supple.  Skin:    General: Skin is warm and dry.     Capillary Refill: Capillary refill takes less than 2 seconds.  Neurological:     General: No focal deficit present.     Mental Status: She is alert.     ED Results / Procedures / Treatments   Labs (all labs ordered are listed, but only abnormal results are displayed) Labs Reviewed  SARS CORONAVIRUS 2 BY RT PCR  GROUP A STREP BY PCR    EKG None  Radiology No results found.  Procedures Procedures  {Document cardiac monitor, telemetry assessment procedure when appropriate:1}  Medications Ordered in ED Medications  sodium chloride 0.9 % bolus 500 mL (has no administration in time range)  ketorolac (TORADOL) 30 MG/ML injection 30 mg (has no administration in time range)  metoCLOPramide (REGLAN) injection 10 mg (has no administration in time range)    ED Course/ Medical Decision Making/ A&P                           Medical Decision Making Risk Prescription drug management.   ***  {Document critical care time when appropriate:1} {Document review of labs and clinical decision tools ie heart score, Chads2Vasc2 etc:1}  {Document your independent review of radiology images, and any outside records:1} {Document your discussion with family members, caretakers, and with consultants:1} {Document social determinants of health affecting pt's care:1} {Document your decision making why or why not admission, treatments were needed:1} Final Clinical Impression(s) / ED Diagnoses Final diagnoses:  None    Rx / DC Orders ED Discharge Orders     None

## 2023-01-11 ENCOUNTER — Ambulatory Visit (INDEPENDENT_AMBULATORY_CARE_PROVIDER_SITE_OTHER): Payer: Medicaid Other | Admitting: Advanced Practice Midwife

## 2023-01-11 ENCOUNTER — Encounter: Payer: Self-pay | Admitting: Advanced Practice Midwife

## 2023-01-11 VITALS — BP 104/71 | HR 71 | Ht 66.0 in | Wt 117.4 lb

## 2023-01-11 DIAGNOSIS — Z3202 Encounter for pregnancy test, result negative: Secondary | ICD-10-CM

## 2023-01-11 DIAGNOSIS — Z113 Encounter for screening for infections with a predominantly sexual mode of transmission: Secondary | ICD-10-CM

## 2023-01-11 DIAGNOSIS — N914 Secondary oligomenorrhea: Secondary | ICD-10-CM | POA: Diagnosis not present

## 2023-01-11 LAB — POCT URINE PREGNANCY: Preg Test, Ur: NEGATIVE

## 2023-01-11 MED ORDER — NORETHIN ACE-ETH ESTRAD-FE 1-20 MG-MCG(24) PO TABS: 1.0000 | ORAL_TABLET | Freq: Every day | ORAL | 11 refills | Status: DC

## 2023-01-11 MED ORDER — ULIPRISTAL ACETATE 30 MG PO TABS
30.0000 mg | ORAL_TABLET | Freq: Once | ORAL | Status: AC
Start: 2023-01-11 — End: ?

## 2023-01-11 MED ORDER — FLUCONAZOLE 150 MG PO TABS: ORAL_TABLET | ORAL | 2 refills | Status: DC

## 2023-01-11 MED ORDER — ONDANSETRON 4 MG PO TBDP: 4.0000 mg | ORAL_TABLET | Freq: Four times a day (QID) | ORAL | 2 refills | Status: DC | PRN

## 2023-01-11 NOTE — Addendum Note (Signed)
Addended by: Christin Fudge on: 01/11/2023 03:13 PM   Modules accepted: Orders

## 2023-01-11 NOTE — Progress Notes (Addendum)
[facility] Visit  Patient name: Sheila Lyons MRN [id number]  Date of birth: 1996-07-02  CC & HPI:  Sheila Lyons is a 27 y.o. Caucasian female presenting today for oligomenorrhea. Got Mirena 10 years ago (ovarian cysts), and then went to depo.  Last depo 8/22. Has only had 3 periods since then, which only lasted a few days each. LMP 12/23.  Wants COCs to regulate cycle. No plans for pregnancy in next 6 months. Used 1/day Monistat 3 days ago for itching, still itching. Had unprotected intercourse 3 days ago.    Pertinent History Reviewed:  Medical & Surgical Hx:   History reviewed. No pertinent past medical history. History reviewed. No pertinent surgical history. Family History  Problem Relation Age of Onset   Hypertension Father    Hypertension Other    Hyperlipidemia Other    Diabetes Other     Current Outpatient Medications:    fluconazole (DIFLUCAN) 150 MG tablet, 1 po stat; repeat in 3 days, Disp: 2 tablet, Rfl: 2   Norethindrone Acetate-Ethinyl Estrad-FE (LOESTRIN 24 FE) 1-20 MG-MCG(24) tablet, Take 1 tablet by mouth daily., Disp: 28 tablet, Rfl: 11   ondansetron (ZOFRAN-ODT) 4 MG disintegrating tablet, Take 1 tablet (4 mg total) by mouth every 6 (six) hours as needed for nausea., Disp: 30 tablet, Rfl: 2   Aspirin-Acetaminophen-Caffeine (GOODY HEADACHE PO), Take 1 Package by mouth 2 (two) times daily as needed (headache). (Patient not taking: Reported on 01/11/2023), Disp: , Rfl:    CRANBERRY PO, Take 1 capsule by mouth daily. (Patient not taking: Reported on 01/11/2023), Disp: , Rfl:    ibuprofen (ADVIL) 200 MG tablet, Take 400-600 mg by mouth every 6 (six) hours as needed for headache or moderate pain. (Patient not taking: Reported on 01/11/2023), Disp: , Rfl:    lidocaine (LIDODERM) 5 %, Place 1 patch onto the skin daily as needed. Apply patch to area most significant pain once per day.  Remove and discard patch within 12 hours of application. (Patient not  taking: Reported on 01/11/2023), Disp: 15 patch, Rfl: 0   methocarbamol (ROBAXIN) 500 MG tablet, Take 1 tablet (500 mg total) by mouth every 8 (eight) hours as needed for muscle spasms. (Patient not taking: Reported on 01/11/2023), Disp: 15 tablet, Rfl: 0   predniSONE (STERAPRED UNI-PAK 21 TAB) 10 MG (21) TBPK tablet, Take by mouth daily. 6, 5, 4, 3, 2, 1 take as written, Disp: 21 tablet, Rfl: 0   Probiotic Product (PROBIOTIC DAILY PO), Take 1 capsule by mouth daily., Disp: , Rfl:   Current Facility-Administered Medications:    ulipristal acetate (ELLA) tablet 30 mg, 30 mg, Oral, Once, Cresenzo-Dishmon, Joaquim Lai, CNM Social History: Reviewed -  reports that she has been smoking cigarettes. She has been smoking an average of .25 packs per day. She has never used smokeless tobacco.  Review of Systems:   Constitutional: Negative for fever and chills Eyes: Negative for visual disturbances Respiratory: Negative for shortness of breath, dyspnea Cardiovascular: Negative for chest pain or palpitations  Gastrointestinal: Negative for vomiting, diarrhea and constipation; no abdominal pain Genitourinary: Negative for dysuria and urgency, vaginal irritation or itching Musculoskeletal: Negative for back pain, joint pain, myalgias  Neurological: Negative for dizziness and headaches    Objective Findings:    Physical Examination: Vitals:   01/11/23 1402  BP: 104/71  Pulse: 71   General appearance - well appearing, and in no distress Mental status - alert, oriented to person, place, and time Chest:  Normal  respiratory effort Heart - normal rate and regular rhythm Abdomen:  Soft, nontender Pelvic: deferred, *used Monistat  2 days ago).  Musculoskeletal:  Normal range of motion without pain Extremities:  No edema    Results for orders placed or performed in visit on 01/11/23 (from the past 24 hour(s))  POCT urine pregnancy   Collection Time: 01/11/23  2:13 PM  Result Value Ref Range   Preg  Test, Ur Negative Negative      Assessment & Plan:  A:   Oligomenorrhea  ? yeast P: Ella today, then tomorrow:  Start LoEstrin, may stop in 6 months to see what cycle does Rx diflucan. If no improvement, can do RN self swab Urine GC/CHl today    Return for 3 months vieso visit med check .  Christin Fudge CNM 01/11/2023 3:13 PM

## 2023-01-11 NOTE — Patient Instructions (Signed)
Oral Contraception Information ?Oral contraceptive pills (OCPs) are medicines taken by mouth to prevent pregnancy. They work by: ?Preventing the ovaries from releasing eggs. ?Thickening mucus in the lower part of the uterus (cervix). This prevents sperm from entering the uterus. ?Thinning the lining of the uterus (endometrium). This prevents a fertilized egg from attaching to the endometrium. ?OCPs are highly effective when taken exactly as prescribed. However, OCPs do not prevent STIs (sexually transmitted infections). Using condoms while on an OCP can help prevent STIs. ?What happens before starting OCPs? ?Before you start taking OCPs: ?You may have a physical exam, blood test, and Pap test. ?Your health care provider will make sure you are a good candidate for oral contraception. OCPs are not a good option for certain women, such as: ?Women who smoke and are older than age 35. ?Women who have or have had certain conditions, such as: ?A history of high blood pressure. ?Deep vein thrombosis. ?Pulmonary embolism. ?Stroke. ?Cardiovascular disease. ?Peripheral vascular disease. ?Ask your health care provider about the possible side effects of the OCP you may be prescribed. Be aware that it can take 2-3 months for your body to adjust to changes in hormone levels. ?Types of oral contraception ? ?Birth control pills contain the hormones estrogen and progestin (synthetic progesterone) or progestin only. ?The combination pill ?This type of pill contains estrogen and progestin hormones. ?Conventional contraception pills come in packs of 21 or 28 pills. ?Some packs with 28-day pills contain estrogen and progestin for the first 21-24 days. Hormone-free tablets, called placebos, are taken for the final 4-7 days. You should have menstrual bleeding during the time you take the placebos. ?In packs with 21 tablets, you take no pills for 7 days. Menstrual bleeding occurs during these days. (Some people prefer taking a pill for 28  days to help establish a routine). ?Extended-interval contraception pills come in packs of 91 pills. The first 84 tablets have both estrogen and progestin. The last 7 pills are placebos. Menstrual bleeding occurs during the placebo days. With this schedule, menstrual bleeding happens once every 3 months. ?Continuous contraception pills come in packs of 28 pills. All pills in the pack contain estrogen and progestin. With this schedule, regular menstrual bleeding does not happen, but there may be spotting or irregular bleeding. ?Progestin-only pills ?This type of pill is often called the mini-pill and contains the progestin hormone only. It comes in packs of 28 pills. In some packs, the last 4 pills are placebos. The pill must be taken at the same time every day. This is very important to prevent pregnancy. Menstrual bleeding may not be regular or predictable. ?What are the advantages? ?Oral contraception provides reliable and continuous contraception if taken as directed. It may treat or decrease symptoms of: ?Menstrual period cramps. ?Irregular menstrual cycle or bleeding. ?Heavy menstrual flow. ?Abnormal uterine bleeding. ?Acne, depending on the type of pill. ?Polycystic ovarian syndrome (POS). ?Endometriosis. ?Iron deficiency anemia. ?Premenstrual symptoms, including severe irritability, depression, or anxiety. ?It also may: ?Reduce the risk of endometrial and ovarian cancer. ?Be used as emergency contraception. ?Prevent ectopic pregnancies and infections of the fallopian tubes. ?What can make OCPs less effective? ?OCPs may be less effective if: ?You forget to take the pill every day. For progestin-only pills, it is especially important to take the pill at the same time each day. Even taking it 3 hours late can increase the risk of pregnancy. ?You have a stomach or intestinal disease that reduces your body's ability to absorb the pill. ?  You take OCPs with other medicines that make OCPs less effective, such as  antibiotics, certain HIV medicines, and some seizure medicines. ?You take expired OCPs. ?You forget to restart the pill after 7 days of not taking it. This refers to the packs of 21 pills. ?What are the side effects and risks? ?OCPs can sometimes cause side effects, such as: ?Headache. ?Depression. ?Trouble sleeping. ?Nausea and vomiting. ?Breast tenderness. ?Irregular bleeding or spotting during the first several months. ?Bloating or fluid retention. ?Increase in blood pressure. ?Combination pills may slightly increase the risk of: ?Blood clots. ?Heart attack. ?Stroke. ?Follow these instructions at home: ?Follow instructions from your health care provider about how to start taking your first cycle of OCPs. Depending on when you start the pill, you may need to use a backup form of birth control, such as condoms, during the first week. Make sure you know what steps to take if you forget to take the pill. ?Summary ?Oral contraceptive pills (OCPs) are medicines taken by mouth to prevent pregnancy. They are highly effective when taken exactly as prescribed. ?OCPs contain a combination of the hormones estrogen and progestin (synthetic progesterone) or progestin only. ?Before you start taking the pill, you may have a physical exam, blood test, and Pap test. Your health care provider will make sure you are a good candidate for oral contraception. ?The combination pill may come in a 21-day pack, a 28-day pack, or a 91-day pack. Progestin-only pills come in packs of 28 pills. ?OCPs can sometimes cause side effects, such as headache, nausea, breast tenderness, or irregular bleeding. ?This information is not intended to replace advice given to you by your health care provider. Make sure you discuss any questions you have with your health care provider. ?Document Revised: 07/02/2020 Document Reviewed: 06/10/2020 ?Elsevier Patient Education ? 2023 [employer] ? ?

## 2023-01-14 LAB — GC/CHLAMYDIA PROBE AMP
Chlamydia trachomatis, NAA: NEGATIVE
Neisseria Gonorrhoeae by PCR: NEGATIVE

## 2023-01-27 ENCOUNTER — Encounter: Payer: Self-pay | Admitting: Emergency Medicine

## 2023-01-27 ENCOUNTER — Ambulatory Visit
Admission: EM | Admit: 2023-01-27 | Discharge: 2023-01-27 | Disposition: A | Discharge status: Home / Self Care | Payer: Medicaid Other | Attending: Nurse Practitioner | Admitting: Nurse Practitioner

## 2023-01-27 DIAGNOSIS — J069 Acute upper respiratory infection, unspecified: Secondary | ICD-10-CM | POA: Insufficient documentation

## 2023-01-27 DIAGNOSIS — Z1152 Encounter for screening for COVID-19: Secondary | ICD-10-CM | POA: Insufficient documentation

## 2023-01-27 DIAGNOSIS — Z8709 Personal history of other diseases of the respiratory system: Secondary | ICD-10-CM | POA: Diagnosis present

## 2023-01-27 LAB — POCT RAPID STREP A (OFFICE): Rapid Strep A Screen: NEGATIVE

## 2023-01-27 MED ORDER — FLUTICASONE PROPIONATE 50 MCG/ACT NA SUSP: 2.0000 | Freq: Every day | NASAL | 0 refills | Status: AC

## 2023-01-27 MED ORDER — PROMETHAZINE-DM 6.25-15 MG/5ML PO SYRP
5.0000 mL | ORAL_SOLUTION | Freq: Four times a day (QID) | ORAL | 0 refills | Status: DC | PRN
Start: 2023-01-27 — End: 2024-02-21

## 2023-01-27 NOTE — ED Provider Notes (Signed)
[facility]    CSN: [id number] Arrival date & time: 01/27/23  1108      History   Chief Complaint No chief complaint on file.   HPI Sheila Lyons is a 27 y.o. female.   The history is provided by the patient.   The patient presents for complaints of head congestion, ears feeling clogged, sore throat, and a productive cough.  Symptoms started over the last 3 to 4 days.  Patient she has pain in her chest with the cough.  She denies fever, but states last night she was "burning up".  She further denies headache, ear pain, wheezing, shortness of breath, difficulty breathing, abdominal pain, nausea, vomiting, or diarrhea.  Patient reports that she did have a coworker who was sick last week.  She states that she has been taking Claritin and Nasacort for her symptoms.  Patient does report a history of seasonal allergies.  Patient denies smoking, but does vape.  History reviewed. No pertinent past medical history.  There are no problems to display for this patient.   History reviewed. No pertinent surgical history.  OB History     Gravida  0   Para  0   Term  0   Preterm  0   AB  0   Living  0      SAB  0   IAB  0   Ectopic  0   Multiple  0   Live Births  0            Home Medications    Prior to Admission medications   Medication Sig Start Date End Date Taking? Authorizing Provider  fluticasone (FLONASE) 50 MCG/ACT nasal spray Place 2 sprays into both nostrils daily. 01/27/23  Yes Daquavion Catala-Warren, Sadie Haber, NP  promethazine-dextromethorphan (PROMETHAZINE-DM) 6.25-15 MG/5ML syrup Take 5 mLs by mouth 4 (four) times daily as needed for cough. 01/27/23  Yes Sloka Volante-Warren, Sadie Haber, NP  Aspirin-Acetaminophen-Caffeine (GOODY HEADACHE PO) Take 1 Package by mouth 2 (two) times daily as needed (headache). Patient not taking: Reported on 01/11/2023    [provider]  CRANBERRY PO Take 1 capsule by mouth daily. Patient not taking: Reported on  01/11/2023    [provider]  ibuprofen (ADVIL) 200 MG tablet Take 400-600 mg by mouth every 6 (six) hours as needed for headache or moderate pain. Patient not taking: Reported on 01/11/2023    [provider]  lidocaine (LIDODERM) 5 % Place 1 patch onto the skin daily as needed. Apply patch to area most significant pain once per day.  Remove and discard patch within 12 hours of application. Patient not taking: Reported on 01/11/2023 01/12/22   Petrucelli, Pleas Koch, PA-C  methocarbamol (ROBAXIN) 500 MG tablet Take 1 tablet (500 mg total) by mouth every 8 (eight) hours as needed for muscle spasms. Patient not taking: Reported on 01/11/2023 01/12/22   Petrucelli, Pleas Koch, PA-C    Family History Family History  Problem Relation Age of Onset   Hypertension Father    Hypertension Other    Hyperlipidemia Other    Diabetes Other     Social History Social History   Tobacco Use   Smoking status: Every Day    Packs/day: .25    Types: Cigarettes   Smokeless tobacco: Never  Vaping Use   Vaping Use: Never used  Substance Use Topics   Alcohol use: No   Drug use: No     Allergies   Penicillins, Sulfa antibiotics, and  Keflex [cephalexin]   Review of Systems Review of Systems Per HPI  Physical Exam Triage Vital Signs ED Triage Vitals  Enc Vitals Group     BP 01/27/23 1116 116/79     Pulse Rate 01/27/23 1116 73     Resp 01/27/23 1116 18     Temp 01/27/23 1116 98.2 F (36.8 C)     Temp Source 01/27/23 1116 Oral     SpO2 01/27/23 1116 97 %     Weight --      Height --      Head Circumference --      Peak Flow --      Pain Score 01/27/23 1117 5     Pain Loc --      Pain Edu? --      Excl. in GC? --    No data found.  Updated Vital Signs BP 116/79 (BP Location: Right Arm)   Pulse 73   Temp 98.2 F (36.8 C) (Oral)   Resp 18   LMP 01/25/2023 (Exact Date) Comment: was on depo for 5 year  SpO2 97%   Visual Acuity Right Eye Distance:   Left Eye  Distance:   Bilateral Distance:    Right Eye Near:   Left Eye Near:    Bilateral Near:     Physical Exam Vitals and nursing note reviewed.  Constitutional:      General: She is not in acute distress.    Appearance: Normal appearance.  HENT:     Head: Normocephalic.     Right Ear: Tympanic membrane, ear canal and external ear normal.     Left Ear: Tympanic membrane, ear canal and external ear normal.     Nose: Congestion present.     Right Turbinates: Enlarged and swollen.     Left Turbinates: Enlarged and swollen.     Right Sinus: No maxillary sinus tenderness or frontal sinus tenderness.     Left Sinus: No maxillary sinus tenderness or frontal sinus tenderness.     Mouth/Throat:     Lips: Pink.     Mouth: Mucous membranes are moist.     Pharynx: Posterior oropharyngeal erythema present.     Comments: Cobblestoning present on posterior oropharynx Eyes:     Extraocular Movements: Extraocular movements intact.     Conjunctiva/sclera: Conjunctivae normal.     Pupils: Pupils are equal, round, and reactive to light.  Cardiovascular:     Rate and Rhythm: Normal rate and regular rhythm.     Pulses: Normal pulses.     Heart sounds: Normal heart sounds.  Pulmonary:     Effort: Pulmonary effort is normal. No respiratory distress.     Breath sounds: Normal breath sounds. No stridor. No wheezing, rhonchi or rales.  Abdominal:     General: Bowel sounds are normal.     Palpations: Abdomen is soft.     Tenderness: There is no abdominal tenderness.  Musculoskeletal:     Cervical back: Normal range of motion.  Lymphadenopathy:     Cervical: No cervical adenopathy.  Skin:    General: Skin is warm and dry.  Neurological:     General: No focal deficit present.     Mental Status: She is alert and oriented to person, place, and time.  Psychiatric:        Mood and Affect: Mood normal.        Behavior: Behavior normal.      UC Treatments / Results  Labs (all labs ordered are  listed, but only abnormal results are displayed) Labs Reviewed  SARS CORONAVIRUS 2 (TAT 6-24 HRS)  CULTURE, GROUP A STREP [facility])  POCT RAPID STREP A (OFFICE)    EKG   Radiology No results found.  Procedures Procedures (including critical care time)  Medications Ordered in UC Medications - No data to display  Initial Impression / Assessment and Plan / UC Course  I have reviewed the triage vital signs and the nursing notes.  Pertinent labs & imaging results that were available during my care of the patient were reviewed by me and considered in my medical decision making (see chart for details).  The patient is well-appearing, she is in no acute distress, vital signs are stable.  Rapid strep test is negative.  Throat culture and COVID test are pending.  Patient is able to receive Paxlovid if her COVID test is positive.  Symptoms appear to be consistent with a viral upper respiratory infection with cough.  Patient also has a history of allergic rhinitis.  Will treat with promethazine DM for her cough, and fluticasone 50 mcg nasal spray for her nasal congestion.  Patient was advised to continue Claritin D that she is currently taking.  Supportive care recommendations were provided and discussed with the patient to include increasing fluids, allowing for plenty of rest, over-the-counter analgesics for pain or discomfort, and normal saline nasal spray throughout the day to help with nasal congestion.  Patient is in agreement with this plan of care and verbalizes understanding.  Discussed indications of when follow-up may be necessary.  Patient stable for discharge.  Work note was provided.   Final Clinical Impressions(s) / UC Diagnoses   Final diagnoses:  Viral upper respiratory tract infection with cough  History of allergic rhinitis  Encounter for screening for COVID-19     Discharge Instructions      The rapid strep test was negative.  A throat culture and COVID test are pending.   As discussed, if your COVID test is positive, you are able to receive Paxlovid.  If you see the results in your MyChart account and it is positive, please contact this office tomorrow. Take medication as prescribed. Increase fluids and allow for plenty of rest. May take over-the-counter Tylenol or ibuprofen as needed for pain, fever, or general discomfort. Warm salt water gargles 3-4 times daily to help with throat pain or discomfort.   May use normal saline nasal spray throughout the day to help with nasal congestion. Recommend using a humidifier at nighttime and sleeping elevated on pillows while cough symptoms persist. As discussed, if symptoms do not improve over the next 5 to 7 days, please follow-up in this clinic or with your primary care physician for further evaluation. Follow-up as needed.     ED Prescriptions     Medication Sig Dispense Auth. Provider   promethazine-dextromethorphan (PROMETHAZINE-DM) 6.25-15 MG/5ML syrup Take 5 mLs by mouth 4 (four) times daily as needed for cough. 118 mL Khiem Gargis-Warren, Sadie Haber, NP   fluticasone (FLONASE) 50 MCG/ACT nasal spray Place 2 sprays into both nostrils daily. 16 g Kamilia Carollo-Warren, Sadie Haber, NP      PDMP not reviewed this encounter.   Abran Cantor, NP 01/27/23 1159

## 2023-01-27 NOTE — Discharge Instructions (Addendum)
The rapid strep test was negative.  A throat culture and COVID test are pending.  As discussed, if your COVID test is positive, you are able to receive Paxlovid.  If you see the results in your MyChart account and it is positive, please contact this office tomorrow. Take medication as prescribed. Increase fluids and allow for plenty of rest. May take over-the-counter Tylenol or ibuprofen as needed for pain, fever, or general discomfort. Warm salt water gargles 3-4 times daily to help with throat pain or discomfort.   May use normal saline nasal spray throughout the day to help with nasal congestion. Recommend using a humidifier at nighttime and sleeping elevated on pillows while cough symptoms persist. As discussed, if symptoms do not improve over the next 5 to 7 days, please follow-up in this clinic or with your primary care physician for further evaluation. Follow-up as needed.

## 2023-01-27 NOTE — ED Triage Notes (Signed)
Head congestion, productive cough, ear feel clogged, sore throat has been taking Claritin and nasocourt.  States chest feel tight and hurts to breath.

## 2023-01-28 LAB — SARS CORONAVIRUS 2 (TAT 6-24 HRS): SARS Coronavirus 2: NEGATIVE

## 2023-01-28 LAB — CULTURE, GROUP A STREP (THRC)

## 2023-01-30 LAB — CULTURE, GROUP A STREP (THRC)

## 2023-05-13 ENCOUNTER — Ambulatory Visit
Admission: EM | Admit: 2023-05-13 | Discharge: 2023-05-13 | Disposition: A | Discharge status: Home / Self Care | Payer: Medicaid Other | Attending: Family Medicine | Admitting: Family Medicine

## 2023-05-13 DIAGNOSIS — G43819 Other migraine, intractable, without status migrainosus: Secondary | ICD-10-CM | POA: Diagnosis not present

## 2023-05-13 MED ORDER — KETOROLAC TROMETHAMINE 30 MG/ML IJ SOLN
30.0000 mg | Freq: Once | INTRAMUSCULAR | Status: AC
  Administered 2023-05-13: 30 mg via INTRAMUSCULAR

## 2023-05-13 MED ORDER — SUMATRIPTAN SUCCINATE 6 MG/0.5ML ~~LOC~~ SOLN
6.0000 mg | Freq: Once | SUBCUTANEOUS | Status: AC
  Administered 2023-05-13: 6 mg via SUBCUTANEOUS

## 2023-05-13 MED ORDER — SUMATRIPTAN SUCCINATE 50 MG PO TABS: ORAL_TABLET | ORAL | 0 refills | Status: DC

## 2023-05-13 MED ORDER — ONDANSETRON 4 MG PO TBDP: 4.0000 mg | ORAL_TABLET | Freq: Three times a day (TID) | ORAL | 0 refills | Status: DC | PRN

## 2023-05-13 NOTE — ED Triage Notes (Signed)
Pt  reports she has a severe throbbing headache x 4 days. PT also has nausea.   States she has had migraines in the past that has lasted 2 weeks.  Eyes are sensitive to light make her head hurt worst.

## 2023-05-13 NOTE — Discharge Instructions (Signed)
We have given you a shot of Toradol today which is an anti-inflammatory medication similar to over-the-counter ibuprofen, Aleve, Advil.  This should stay in your system for about 2 days so do not take any of these types of over-the-counter medications within that timeframe.  It is safe, however, to still be taking Tylenol as needed for pain.  We have also given you a shot of a migraine abortive medication called Imitrex.  If you are still having migraine symptoms tomorrow, you may start the pill form that I have sent to the pharmacy additionally.  I have also sent over some Zofran to take for nausea.

## 2023-05-16 NOTE — ED Provider Notes (Signed)
[facility]    CSN: [id number] Arrival date & time: 05/13/23  1516      History   Chief Complaint No chief complaint on file.   HPI Sheila Lyons is a 27 y.o. female.   Presenting today with 4-day history of throbbing frontal headache, photophobia, nausea.  Denies head injury, loss of vision, vomiting, mental status change, extremity weakness numbness or tingling.  She states she has had 1 other episode like this that was diagnosed as a migraine and lasted about 2 weeks at that time.  Trying over-the-counter pain relievers with no relief.    History reviewed. No pertinent past medical history.  There are no problems to display for this patient.   History reviewed. No pertinent surgical history.  OB History     Gravida  0   Para  0   Term  0   Preterm  0   AB  0   Living  0      SAB  0   IAB  0   Ectopic  0   Multiple  0   Live Births  0            Home Medications    Prior to Admission medications   Medication Sig Start Date End Date Taking? Authorizing Provider  ondansetron (ZOFRAN-ODT) 4 MG disintegrating tablet Take 1 tablet (4 mg total) by mouth every 8 (eight) hours as needed for nausea or vomiting. 05/13/23  Yes Particia Nearing, PA-C  SUMAtriptan (IMITREX) 50 MG tablet Take 1 tab at onset of migraine. May repeat in 2 hours if headache persists or recurs. Max of 2 tabs daily 05/13/23  Yes Particia Nearing, PA-C  Aspirin-Acetaminophen-Caffeine (GOODY HEADACHE PO) Take 1 Package by mouth 2 (two) times daily as needed (headache). Patient not taking: Reported on 01/11/2023    [provider]  CRANBERRY PO Take 1 capsule by mouth daily. Patient not taking: Reported on 01/11/2023    [provider]  fluticasone (FLONASE) 50 MCG/ACT nasal spray Place 2 sprays into both nostrils daily. 01/27/23   Leath-Warren, Sadie Haber, NP  ibuprofen (ADVIL) 200 MG tablet Take 400-600 mg by mouth every 6 (six) hours as  needed for headache or moderate pain. Patient not taking: Reported on 01/11/2023    [provider]  lidocaine (LIDODERM) 5 % Place 1 patch onto the skin daily as needed. Apply patch to area most significant pain once per day.  Remove and discard patch within 12 hours of application. Patient not taking: Reported on 01/11/2023 01/12/22   Petrucelli, Pleas Koch, PA-C  methocarbamol (ROBAXIN) 500 MG tablet Take 1 tablet (500 mg total) by mouth every 8 (eight) hours as needed for muscle spasms. Patient not taking: Reported on 01/11/2023 01/12/22   Petrucelli, Pleas Koch, PA-C  promethazine-dextromethorphan (PROMETHAZINE-DM) 6.25-15 MG/5ML syrup Take 5 mLs by mouth 4 (four) times daily as needed for cough. 01/27/23   Leath-Warren, Sadie Haber, NP    Family History Family History  Problem Relation Age of Onset   Hypertension Father    Hypertension Other    Hyperlipidemia Other    Diabetes Other     Social History Social History   Tobacco Use   Smoking status: Every Day    Current packs/day: 0.25    Types: Cigarettes   Smokeless tobacco: Never  Vaping Use   Vaping status: Never Used  Substance Use Topics   Alcohol use: No   Drug use: No  Allergies   Penicillins, Sulfa antibiotics, and Keflex [cephalexin]   Review of Systems Review of Systems Per HPI  Physical Exam Triage Vital Signs ED Triage Vitals [05/13/23 1524]  Encounter Vitals Group     BP 106/73     Systolic BP Percentile      Diastolic BP Percentile      Pulse Rate 75     Resp 18     Temp 98.1 F (36.7 C)     Temp Source Oral     SpO2 98 %     Weight      Height      Head Circumference      Peak Flow      Pain Score 7     Pain Loc      Pain Education      Exclude from Growth Chart    No data found.  Updated Vital Signs BP 106/73 (BP Location: Right Arm)   Pulse 75   Temp 98.1 F (36.7 C) (Oral)   Resp 18   LMP 05/12/2023 (Exact Date) Comment: end date  SpO2 98%   Visual Acuity Right  Eye Distance:   Left Eye Distance:   Bilateral Distance:    Right Eye Near:   Left Eye Near:    Bilateral Near:     Physical Exam Vitals and nursing note reviewed.  Constitutional:      Appearance: Normal appearance. She is not ill-appearing.  HENT:     Head: Atraumatic.  Eyes:     Extraocular Movements: Extraocular movements intact.     Conjunctiva/sclera: Conjunctivae normal.     Pupils: Pupils are equal, round, and reactive to light.  Cardiovascular:     Rate and Rhythm: Normal rate and regular rhythm.     Heart sounds: Normal heart sounds.  Pulmonary:     Effort: Pulmonary effort is normal.     Breath sounds: Normal breath sounds.  Musculoskeletal:        General: Normal range of motion.     Cervical back: Normal range of motion and neck supple.  Skin:    General: Skin is warm and dry.  Neurological:     General: No focal deficit present.     Mental Status: She is alert and oriented to person, place, and time.     Cranial Nerves: No cranial nerve deficit.     Motor: No weakness.     Gait: Gait normal.  Psychiatric:        Mood and Affect: Mood normal.        Thought Content: Thought content normal.        Judgment: Judgment normal.      UC Treatments / Results  Labs (all labs ordered are listed, but only abnormal results are displayed) Labs Reviewed - No data to display  EKG   Radiology No results found.  Procedures Procedures (including critical care time)  Medications Ordered in UC Medications  ketorolac (TORADOL) 30 MG/ML injection 30 mg (30 mg Intramuscular Given 05/13/23 1556)  SUMAtriptan (IMITREX) injection 6 mg (6 mg Subcutaneous Given 05/13/23 1556)    Initial Impression / Assessment and Plan / UC Course  I have reviewed the triage vital signs and the nursing notes.  Pertinent labs & imaging results that were available during my care of the patient were reviewed by me and considered in my medical decision making (see chart for details).      Vital signs and exam benign and reassuring today with  no red flag findings, no focal neurologic deficits.  Will treat with IM Toradol, Imitrex and p.o. Imitrex as needed going forward.  Discussed supportive over-the-counter medications, Zofran for nausea as needed, home care additionally.  Return for worsening symptoms.  Final Clinical Impressions(s) / UC Diagnoses   Final diagnoses:  Other migraine without status migrainosus, intractable     Discharge Instructions      We have given you a shot of Toradol today which is an anti-inflammatory medication similar to over-the-counter ibuprofen, Aleve, Advil.  This should stay in your system for about 2 days so do not take any of these types of over-the-counter medications within that timeframe.  It is safe, however, to still be taking Tylenol as needed for pain.  We have also given you a shot of a migraine abortive medication called Imitrex.  If you are still having migraine symptoms tomorrow, you may start the pill form that I have sent to the pharmacy additionally.  I have also sent over some Zofran to take for nausea.    ED Prescriptions     Medication Sig Dispense Auth. Provider   SUMAtriptan (IMITREX) 50 MG tablet Take 1 tab at onset of migraine. May repeat in 2 hours if headache persists or recurs. Max of 2 tabs daily 10 tablet Particia Nearing, PA-C   ondansetron (ZOFRAN-ODT) 4 MG disintegrating tablet Take 1 tablet (4 mg total) by mouth every 8 (eight) hours as needed for nausea or vomiting. 20 tablet Particia Nearing, [place]      PDMP not reviewed this encounter.   Particia Nearing, [place] 05/16/23 [phone]

## 2023-08-05 ENCOUNTER — Ambulatory Visit
Admission: EM | Admit: 2023-08-05 | Discharge: 2023-08-05 | Disposition: A | Discharge status: Home / Self Care | Payer: Medicaid Other

## 2023-08-05 DIAGNOSIS — J069 Acute upper respiratory infection, unspecified: Secondary | ICD-10-CM | POA: Diagnosis not present

## 2023-08-05 MED ORDER — PROMETHAZINE-DM 6.25-15 MG/5ML PO SYRP: 5.0000 mL | ORAL_SOLUTION | Freq: Four times a day (QID) | ORAL | 0 refills | Status: DC | PRN

## 2023-08-05 MED ORDER — FLUTICASONE PROPIONATE 50 MCG/ACT NA SUSP: 1.0000 | Freq: Two times a day (BID) | NASAL | 2 refills | Status: DC

## 2023-08-05 NOTE — ED Triage Notes (Signed)
Pt states that she has some nasal congestion, headache, body aches, sore throat and chest pain. X2 days

## 2023-08-05 NOTE — ED Provider Notes (Signed)
[facility]    CSN: [id number] Arrival date & time: 08/05/23  1407      History   Chief Complaint Chief Complaint  Patient presents with   Nasal Congestion    Nasal congestion, headache, body aches, sore throat and chest pain.    HPI Sheila Lyons is a 27 y.o. female.   Patient presenting today with 2-day history of nasal congestion, headaches, body aches, sore throat, cough, chest tightness.  Denies fever, chest pain, shortness of breath, abdominal pain, nausea vomiting or diarrhea.  So far trying DayQuil, NyQuil, antihistamines and Flonase with minimal relief.  Recently babysat her niece who is sick.  No known chronic pulmonary disease.    History reviewed. No pertinent past medical history.  There are no problems to display for this patient.   History reviewed. No pertinent surgical history.  OB History     Gravida  0   Para  0   Term  0   Preterm  0   AB  0   Living  0      SAB  0   IAB  0   Ectopic  0   Multiple  0   Live Births  0            Home Medications    Prior to Admission medications   Medication Sig Start Date End Date Taking? Authorizing Provider  Fexofenadine HCl (ALLEGRA PO) Take by mouth.   Yes [provider]  fluticasone (FLONASE) 50 MCG/ACT nasal spray Place 2 sprays into both nostrils daily. 01/27/23  Yes Leath-Warren, Sadie Haber, NP  fluticasone (FLONASE) 50 MCG/ACT nasal spray Place 1 spray into both nostrils 2 (two) times daily. 08/05/23  Yes Particia Nearing, PA-C  promethazine-dextromethorphan (PROMETHAZINE-DM) 6.25-15 MG/5ML syrup Take 5 mLs by mouth 4 (four) times daily as needed. 08/05/23  Yes Particia Nearing, PA-C  Aspirin-Acetaminophen-Caffeine (GOODY HEADACHE PO) Take 1 Package by mouth 2 (two) times daily as needed (headache). Patient not taking: Reported on 01/11/2023    [provider]  CRANBERRY PO Take 1 capsule by mouth daily. Patient not taking: Reported on  01/11/2023    [provider]  ibuprofen (ADVIL) 200 MG tablet Take 400-600 mg by mouth every 6 (six) hours as needed for headache or moderate pain. Patient not taking: Reported on 01/11/2023    [provider]  lidocaine (LIDODERM) 5 % Place 1 patch onto the skin daily as needed. Apply patch to area most significant pain once per day.  Remove and discard patch within 12 hours of application. Patient not taking: Reported on 01/11/2023 01/12/22   Petrucelli, Pleas Koch, PA-C  methocarbamol (ROBAXIN) 500 MG tablet Take 1 tablet (500 mg total) by mouth every 8 (eight) hours as needed for muscle spasms. Patient not taking: Reported on 01/11/2023 01/12/22   Petrucelli, Lelon Mast R, PA-C  ondansetron (ZOFRAN-ODT) 4 MG disintegrating tablet Take 1 tablet (4 mg total) by mouth every 8 (eight) hours as needed for nausea or vomiting. 05/13/23   Particia Nearing, PA-C  promethazine-dextromethorphan (PROMETHAZINE-DM) 6.25-15 MG/5ML syrup Take 5 mLs by mouth 4 (four) times daily as needed for cough. 01/27/23   Leath-Warren, Sadie Haber, NP  SUMAtriptan (IMITREX) 50 MG tablet Take 1 tab at onset of migraine. May repeat in 2 hours if headache persists or recurs. Max of 2 tabs daily 05/13/23   Particia Nearing, PA-C    Family History Family History  Problem Relation Age of Onset  Hypertension Father    Hypertension Other    Hyperlipidemia Other    Diabetes Other     Social History Social History   Tobacco Use   Smoking status: Former    Current packs/day: 0.25    Types: Cigarettes   Smokeless tobacco: Never  Vaping Use   Vaping status: Never Used  Substance Use Topics   Alcohol use: No   Drug use: No     Allergies   Penicillins, Sulfa antibiotics, and Keflex [cephalexin]   Review of Systems Review of Systems Per HPI  Physical Exam Triage Vital Signs ED Triage Vitals  Encounter Vitals Group     BP 08/05/23 1502 95/65     Systolic BP Percentile --      Diastolic  BP Percentile --      Pulse Rate 08/05/23 1502 61     Resp 08/05/23 1502 18     Temp 08/05/23 1502 98 F (36.7 C)     Temp Source 08/05/23 1502 Oral     SpO2 08/05/23 1502 99 %     Weight 08/05/23 1459 126 lb (57.2 kg)     Height 08/05/23 1459 5\' 6"  (1.676 m)     Head Circumference --      Peak Flow --      Pain Score 08/05/23 1459 6     Pain Loc --      Pain Education --      Exclude from Growth Chart --    No data found.  Updated Vital Signs BP 95/65 (BP Location: Right Arm)   Pulse 61   Temp 98 F (36.7 C) (Oral)   Resp 18   Ht 5\' 6"  (1.676 m)   Wt 126 lb (57.2 kg)   LMP 07/13/2023   SpO2 99%   BMI 20.34 kg/m   Visual Acuity Right Eye Distance:   Left Eye Distance:   Bilateral Distance:    Right Eye Near:   Left Eye Near:    Bilateral Near:     Physical Exam Vitals and nursing note reviewed.  Constitutional:      Appearance: Normal appearance.  HENT:     Head: Atraumatic.     Right Ear: Tympanic membrane and external ear normal.     Left Ear: Tympanic membrane and external ear normal.     Nose: Rhinorrhea present.     Mouth/Throat:     Mouth: Mucous membranes are moist.     Pharynx: Posterior oropharyngeal erythema present.  Eyes:     Extraocular Movements: Extraocular movements intact.     Conjunctiva/sclera: Conjunctivae normal.  Cardiovascular:     Rate and Rhythm: Normal rate and regular rhythm.     Heart sounds: Normal heart sounds.  Pulmonary:     Effort: Pulmonary effort is normal.     Breath sounds: Normal breath sounds. No wheezing or rales.  Musculoskeletal:        General: Normal range of motion.     Cervical back: Normal range of motion and neck supple.  Skin:    General: Skin is warm and dry.  Neurological:     Mental Status: She is alert and oriented to person, place, and time.  Psychiatric:        Mood and Affect: Mood normal.        Thought Content: Thought content normal.      UC Treatments / Results  Labs (all labs  ordered are listed, but only abnormal results are displayed) Labs Reviewed -  No data to display  EKG   Radiology No results found.  Procedures Procedures (including critical care time)  Medications Ordered in UC Medications - No data to display  Initial Impression / Assessment and Plan / UC Course  I have reviewed the triage vital signs and the nursing notes.  Pertinent labs & imaging results that were available during my care of the patient were reviewed by me and considered in my medical decision making (see chart for details).     Vital signs and exam reassuring today and suggestive of a viral respiratory infection.  Declines viral testing today with shared decision making, we will treat with Flonase, Phenergan DM, supportive over-the-counter medications and home care.  Return for worsening symptoms.  Final Clinical Impressions(s) / UC Diagnoses   Final diagnoses:  Viral URI with cough   Discharge Instructions   None    ED Prescriptions     Medication Sig Dispense Auth. Provider   promethazine-dextromethorphan (PROMETHAZINE-DM) 6.25-15 MG/5ML syrup Take 5 mLs by mouth 4 (four) times daily as needed. 100 mL Particia Nearing, PA-C   fluticasone [facility]) 50 MCG/ACT nasal spray Place 1 spray into both nostrils 2 (two) times daily. 16 g Particia Nearing, [place]      PDMP not reviewed this encounter.   Particia Nearing, [place] 08/05/23 1535

## 2023-11-15 LAB — CYTOLOGY - PAP

## 2024-02-06 ENCOUNTER — Other Ambulatory Visit (HOSPITAL_COMMUNITY): Payer: Self-pay | Admitting: Nurse Practitioner

## 2024-02-06 DIAGNOSIS — Z8742 Personal history of other diseases of the female genital tract: Secondary | ICD-10-CM

## 2024-02-07 ENCOUNTER — Ambulatory Visit: Admitting: Orthopedic Surgery

## 2024-02-20 ENCOUNTER — Ambulatory Visit (HOSPITAL_COMMUNITY)
Admission: RE | Admit: 2024-02-20 | Discharge: 2024-02-20 | Disposition: A | Discharge status: Home / Self Care | Source: Ambulatory Visit | Attending: Nurse Practitioner | Admitting: Nurse Practitioner

## 2024-02-20 DIAGNOSIS — Z8742 Personal history of other diseases of the female genital tract: Secondary | ICD-10-CM | POA: Insufficient documentation

## 2024-02-21 ENCOUNTER — Ambulatory Visit: Admitting: Orthopedic Surgery

## 2024-02-21 ENCOUNTER — Encounter: Payer: Self-pay | Admitting: Orthopedic Surgery

## 2024-02-21 VITALS — BP 143/105 | HR 84 | Ht 67.0 in | Wt 137.0 lb

## 2024-02-21 DIAGNOSIS — M5134 Other intervertebral disc degeneration, thoracic region: Secondary | ICD-10-CM

## 2024-02-21 NOTE — Progress Notes (Signed)
 Patient ID: Sheila Lyons, female   DOB: [birth year], 28 y.o.   MRN: [id number]  ASSESSMENT AND PLAN : Encounter Diagnosis  Name Primary?   Bulging of thoracic intervertebral disc Yes    28 year old female vague symptoms periscapular thoracic back pain with occasional numbness and tingling left arm  Bulging thoracic discs on imaging do not appear to be surgical, has not had physical therapy yet  Recommend physical therapy for 6 weeks then recheck if no improvement then I think an orthospine referral is needed    Chief Complaint  Patient presents with   Arm Problem    Left arm numbness x 2years     HPI Sheila Lyons is a 28 y.o. female.  Who presents for evaluation of thoracic back pain primarily in the T4-7 range left side with imaging studies suggesting bulging disks in this area.  There is some associated numbness and tingling that occur in the left arm but she denies any neck symptoms  Prior treatment includes NSAIDs chiropractor and muscle relaxer  Review of Systems Review of Systems  Constitutional:  Negative for fever and unexpected weight change.  Gastrointestinal: Negative.   Genitourinary: Negative.   Neurological:  Negative for numbness.       History reviewed. No pertinent past medical history.  History reviewed. No pertinent surgical history.  Family History  Problem Relation Age of Onset   Hypertension Father    Hypertension Other    Hyperlipidemia Other    Diabetes Other     Social History Social History   Tobacco Use   Smoking status: Former    Current packs/day: 0.25    Types: Cigarettes   Smokeless tobacco: Never  Vaping Use   Vaping status: Never Used  Substance Use Topics   Alcohol use: No   Drug use: No    Allergies  Allergen Reactions   Penicillins    Sulfa  Antibiotics    Keflex [Cephalexin] Hives and Rash    Current Outpatient Medications  Medication Sig Dispense Refill   cyclobenzaprine (FEXMID) 7.5 MG tablet  1 tablet at bedtime Orally Once a day for 30 days     Fexofenadine HCl (ALLEGRA PO) Take by mouth.     fluticasone  (FLONASE ) 50 MCG/ACT nasal spray Place 2 sprays into both nostrils daily. 16 g 0   fluticasone  (FLONASE ) 50 MCG/ACT nasal spray Place 1 spray into both nostrils 2 (two) times daily. 16 g 2   methocarbamol  (ROBAXIN ) 500 MG tablet Take 1 tablet (500 mg total) by mouth every 8 (eight) hours as needed for muscle spasms. (Patient not taking: Reported on 01/11/2023) 15 tablet 0   SUMAtriptan  (IMITREX ) 50 MG tablet Take 1 tab at onset of migraine. May repeat in 2 hours if headache persists or recurs. Max of 2 tabs daily 10 tablet 0   Current Facility-Administered Medications  Medication Dose Route Frequency Provider Last Rate Last Admin   ulipristal acetate  (ELLA ) tablet 30 mg  30 mg Oral Once Cresenzo-Dishmon, Frances, CNM           Physical Exam BP (!) 143/105   Pulse 84   Ht 5\' 7"  (1.702 m)   Wt 137 lb (62.1 kg)   BMI 21.46 kg/m   The patient is well developed well nourished and well groomed.  Orientation to person place and time is normal  Mood is pleasant.  Ambulatory status normal no assistive devices and no noticeable limp  Spinal alignment seems normal hip heights and shoulder heights are  equal and normal  She has a little hamstring tightness on forward bend some mild discomfort in the thoracic spine on flexion and extension as well as rotation  Vascular and neurovascular exam including reflexes and strength assessment is normal  There is no radiation from the thoracic region around the T3-4-5 dermatomes   MEDICAL DECISION MAKING   Encounter Diagnosis  Name Primary?   Bulging of thoracic intervertebral disc Yes    Rec PT for 6 weeks then recheck, if not improved then see OrthoSpine

## 2024-02-21 NOTE — Progress Notes (Signed)
  Intake history:  BP (!) 143/105   Pulse 84   Ht 5\' 7"  (1.702 m)   Wt 137 lb (62.1 kg)   BMI 21.46 kg/m  Body mass index is 21.46 kg/m.    WHAT ARE WE SEEING YOU FOR TODAY?   Upper Thoracic spine pain and Left arm numbness for 2 years comes and goes   How long has this bothered you? (DOI?DOS?WS?)  2 year(s) ago  Anticoag.  No  Diabetes No  Heart disease No  Hypertension No  SMOKING HX No  Kidney disease No  Any ALLERGIES ______________________________________________   Treatment:  Have you taken:  Tylenol  No  Advil  Yes  Had PT No  Had injection No  Other  _______________Chiropractor muscle relaxer __________

## 2024-03-27 NOTE — Therapy (Signed)
 OUTPATIENT PHYSICAL THERAPY THORACOLUMBAR EVALUATION   Patient Name: Sheila Lyons MRN: [id number] DOB:[birth year], 28 y.o., female Today's Date: 03/28/2024  END OF SESSION:  PT End of Session - 03/28/24 1718     Visit Number 1    Date for PT Re-Evaluation 05/09/24    Authorization Type [place] [employer]ICA[employer] [place]    Authorization Time Period no auth    Progress Note Due on Visit 10    PT Start Time 1307    PT Stop Time 1344    PT Time Calculation (min) 37 min    Activity Tolerance Patient tolerated treatment well;Patient limited by pain    Behavior During Therapy [facility] for tasks assessed/performed          History reviewed. No pertinent past medical history. History reviewed. No pertinent surgical history. There are no active problems to display for this patient.   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 Pt has not has PT before. 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 Pt states that pain stays on the left side, both left arm and leg have had numbness due to the slipped disc. Pt states nothing completely takes pain away especially the nerve sensation it just goes away on its own. Pt reports stopping gym membership about a year ago due to back pain.  PERTINENT HISTORY:  Thoracic slipped disc  PAIN:  Are you having pain? Yes: NPRS  scale: 7/10 Pain location: mainly mid thoracic spine and left arm Pain description: burning, numbness/asleep sensation Aggravating factors: sleeping on stomach, avoids lifting, after work Relieving factors: lidocane patches, heat and ice cycle, advil   PRECAUTIONS: None  RED FLAGS: None   WEIGHT BEARING RESTRICTIONS: No  FALLS:  Has patient fallen in last 6 months? No  OCCUPATION: Server at [employer]   PLOF: Independent  PATIENT GOALS: Decrease the pain frequency and intensity, get back into working out   NEXT MD VISIT: July  OBJECTIVE:  Note: Objective measures  were completed at Evaluation unless otherwise noted.  DIAGNOSTIC FINDINGS:  CLINICAL DATA:  Initial evaluation for left arm and leg weakness for 2 days.   EXAM: MRI THORACIC WITHOUT AND WITH CONTRAST   TECHNIQUE: Multiplanar and multiecho pulse sequences of the thoracic spine were obtained without and with intravenous contrast.   CONTRAST:  5mL GADAVIST  GADOBUTROL  1 MMOL/ML IV SOLN   COMPARISON:  None.   FINDINGS: Alignment: Physiologic with preservation of the normal thoracic kyphosis. No listhesis.   Vertebrae: Vertebral body height maintained without acute or chronic fracture. Bone marrow signal intensity within normal limits. No discrete or worrisome osseous lesions. No abnormal marrow edema or enhancement.   Cord: Normal signal and morphology. No evidence for demyelinating disease. No abnormal enhancement.   Paraspinal and other soft tissues: Unremarkable.   Disc levels:   T1-2: Unremarkable.   T2-3: Unremarkable.   T3-4: Left paracentral disc protrusion indents the left ventral thecal sac (series 18, image 11). Secondary flattening of the left ventral cord without cord signal changes or significant spinal stenosis. Foramina remain patent.   T4-5: Small left paracentral disc protrusion indents the ventral thecal sac (series 18, image 14). Mild flattening of the left hemi cord  without cord signal changes. No significant spinal stenosis. Foramina remain patent.   T5-6: Tiny right central disc protrusion minimally indents the ventral thecal sac without significant stenosis or cord deformity. Foramina remain patent.   T6-7: Minimal disc bulge. No significant spinal stenosis. Foramina remain patent.   T7-8: Minimal left eccentric disc bulge. Mild flattening of the ventral thecal sac without significant spinal stenosis or cord deformity. Foramina remain patent.   T8-9: Mild left eccentric disc bulge. No significant spinal stenosis or cord deformity. Foramina remain patent.   T9-10: Unremarkable.   T10-11: Unremarkable.   T11-12: Unremarkable.   T12-L1: Unremarkable.   IMPRESSION: 1. Normal MRI appearance of the thoracic spinal cord. No evidence for demyelinating disease. 2. Small left paracentral disc protrusions at T3-4 and T4-5 with mild flattening of the left hemi cord, but no cord signal changes. Findings could contribute to left-sided symptoms. 3. Additional minor spondylosis at T5-6 through T8-9 without significant stenosis or impingement.  PATIENT SURVEYS:  Modified Oswestry 7/50   COGNITION: Overall cognitive status: Within functional limits for tasks assessed     SENSATION: Left shoulder always numb feeling, posterior hand a little different, palm same sensation   POSTURE: rounded shoulders, forward head, and increased thoracic kyphosis  PALPATION: Pt tender to palpation and mobilization of cervical and thoracic spinal segments. Tenderness also noted in paraspinal musculature of thoracic spine  LUMBAR/THROACIC ROM:   AROM eval  Flexion WFL, pain but not as bad as extension  Extension 50% available, pain in left arm  Right lateral flexion WFL  Left lateral flexion WFL, Pain with OP  Right rotation No pain   Left rotation No pain   (Blank rows = not tested) CERVICAL ROM:   Active ROM A/PROM (deg) eval  Flexion pain   Extension Little pain  Right lateral flexion WFL  Left lateral flexion WFL  Right rotation WFL  Left rotation WFL   (Blank rows = not tested)  LOWER EXTREMITY ROM:     Active  Right eval Left eval  Hip flexion    Hip extension    Hip abduction    Hip adduction    Hip internal rotation    Hip external rotation    Knee flexion    Knee extension  Ankle dorsiflexion    Ankle plantarflexion    Ankle inversion    Ankle eversion     (Blank rows = not tested)   UPPER EXTREMITY MMT:  MMT Right eval Left eval  Shoulder flexion 4 4  Shoulder extension 4 4  Shoulder abduction 4 4  Shoulder adduction    Shoulder extension    Shoulder internal rotation    Shoulder external rotation    Middle trapezius 3 3  Lower trapezius 3 3  Elbow flexion    Elbow extension    Wrist flexion    Wrist extension    Wrist ulnar deviation    Wrist radial deviation    Wrist pronation    Wrist supination    Grip strength     (Blank rows = not tested)   TREATMENT DATE:  03/27/2024  Evaluation: -ROM measured, Strength assessed, HEP prescribed, pt educated on prognosis, findings, and importance of HEP compliance if given.                                                                                                                                     PATIENT EDUCATION:  Education details: Pt was educated on findings of PT evaluation, prognosis, frequency of therapy visits and rationale, attendance policy, and HEP if given.   Person educated: Patient Education method: Explanation, Verbal cues, and Handouts Education comprehension: verbalized understanding, verbal cues required, tactile cues required, and needs further education  HOME EXERCISE PROGRAM: Access Code: [email or web address] URL: [email or web address] Date: 03/28/2024 Prepared by: Armond Bertin  Exercises - Seated Scapular Retraction  - 1 x daily - 7 x weekly - 3 sets - 10 reps - Seated Cervical Retraction  - 1 x  daily - 7 x weekly - 3 sets - 10 reps - Quadruped Full Range Thoracic Rotation with Reach  - 1 x daily - 7 x weekly - 3 sets - 10 reps  ASSESSMENT:  CLINICAL IMPRESSION: Patient is a 28 y.o. female who was seen today for physical therapy evaluation and treatment for M51.34 (ICD-10-CM) - Bulging of thoracic intervertebral disc.   Patient demonstrates pain in thoracic spine, cervical spine , decreased activity tolerance and abnormal posture. Decreased strength of parascapular noted as well as BUE strength.  Patient also demonstrates increased tenderness to palpation of thoracic spine, cervical spinal segments and corresponding paraspinal musculature. Patient requires education on proper posture, posterior musculature imbalance, role of PT and importance of HEP. Patient would benefit from skilled physical therapy for decreased back pain, increased endurance with thoracic mobility, increased UE/parascapular strength, and increased pain free ROM in cervical and thoracic spinal segments for improved functional mobility, return to higher level of function with ADLs, and progress towards therapy goals.   OBJECTIVE IMPAIRMENTS: decreased activity tolerance, decreased mobility, decreased ROM, decreased strength, postural dysfunction, and pain.   ACTIVITY LIMITATIONS: carrying, lifting, bending, sleeping, and reach over head  PARTICIPATION LIMITATIONS: meal prep, cleaning,  shopping, community activity, occupation, and yard work  PERSONAL FACTORS: Age, Past/current experiences, Time since onset of injury/illness/exacerbation, and 1 comorbidity: slipped disc are also affecting patient's functional outcome.   REHAB POTENTIAL: Good  CLINICAL DECISION MAKING: Stable/uncomplicated  EVALUATION COMPLEXITY: Low   GOALS: Goals reviewed with patient? No  SHORT TERM GOALS: Target date: 04/18/24  Pt will be independent with HEP in order to demonstrate participation in Physical Therapy POC.  Baseline: Goal  status: INITIAL  2.  Pt will report 5/10 pain at worst with cervical/thoracic mobility in order to demonstrate improved pain with ADLs.  Baseline: see objective Goal status: INITIAL  LONG TERM GOALS: Target date: 05/09/24  Pt will demonstrate increase in parascapular strength to at least a 4/5 MMT bilaterally to improve musculature imbalance and posture for decreased contributing factor to slipped thoracic disc.  Baseline: see objective.  Goal status: INITIAL  2.  Pt will improve cervical and thoracic ROM (flex/ext/lateral flexion/rotation) to pain free at end range in order to demonstrate improved functional mobility with out pain. Baseline: see objective. (Pain with end range and OP at eval Goal status: INITIAL  3.  Pt will improve Modified Oswestry score by at least 6 points in order to demonstrate decreased pain with functional goals and outcomes. Baseline: see objective.  Goal status: INITIAL  4.  Pt will report 3/10 pain at worst with cervical/thoracic mobility in order to demonstrate reduced pain with ADLs that require use of cervical spine musculature (driving, washing hair, reaching to elevated cabinet).  Baseline: see objective.  Goal status: INITIAL     PLAN:  PT FREQUENCY: 1-2x/week  PT DURATION: 6 weeks  PLANNED INTERVENTIONS: 97110-Therapeutic exercises, 97530- Therapeutic activity, [id number]- Neuromuscular re-education, 97535- Self Care, [id number]- Manual therapy, Patient/Family education, Joint mobilization, Spinal mobilization, Cryotherapy, and Moist heat.  PLAN FOR NEXT SESSION: progress core and thoracic/cervical mobility, review goals and HEP (progress as tolerated)   Armond Bertin, PT, DPT [facility] Office: [phone] 5:49 PM, 03/28/24

## 2024-03-28 ENCOUNTER — Encounter (HOSPITAL_COMMUNITY): Payer: Self-pay

## 2024-03-28 ENCOUNTER — Ambulatory Visit (HOSPITAL_COMMUNITY): Attending: Orthopedic Surgery

## 2024-03-28 ENCOUNTER — Other Ambulatory Visit: Payer: Self-pay

## 2024-03-28 DIAGNOSIS — R29898 Other symptoms and signs involving the musculoskeletal system: Secondary | ICD-10-CM | POA: Diagnosis present

## 2024-03-28 DIAGNOSIS — M546 Pain in thoracic spine: Secondary | ICD-10-CM | POA: Diagnosis present

## 2024-03-28 DIAGNOSIS — M542 Cervicalgia: Secondary | ICD-10-CM | POA: Insufficient documentation

## 2024-03-28 DIAGNOSIS — M5134 Other intervertebral disc degeneration, thoracic region: Secondary | ICD-10-CM | POA: Insufficient documentation

## 2024-03-28 DIAGNOSIS — G8929 Other chronic pain: Secondary | ICD-10-CM | POA: Insufficient documentation

## 2024-04-04 ENCOUNTER — Ambulatory Visit: Admitting: Orthopedic Surgery

## 2024-04-14 ENCOUNTER — Encounter (HOSPITAL_COMMUNITY): Payer: Self-pay

## 2024-04-14 ENCOUNTER — Ambulatory Visit (HOSPITAL_COMMUNITY)

## 2024-04-14 DIAGNOSIS — M542 Cervicalgia: Secondary | ICD-10-CM

## 2024-04-14 DIAGNOSIS — G8929 Other chronic pain: Secondary | ICD-10-CM

## 2024-04-14 DIAGNOSIS — R29898 Other symptoms and signs involving the musculoskeletal system: Secondary | ICD-10-CM

## 2024-04-14 DIAGNOSIS — M546 Pain in thoracic spine: Secondary | ICD-10-CM | POA: Diagnosis not present

## 2024-04-14 NOTE — Therapy (Signed)
 OUTPATIENT PHYSICAL THERAPY THORACOLUMBAR TREATMENT   Patient Name: Sheila Lyons MRN: [id number] DOB:09/24/1996, 28 y.o., female Today's Date: 04/14/2024  END OF SESSION:  PT End of Session - 04/14/24 0805     Visit Number 2    Number of Visits 12    Date for PT Re-Evaluation 05/09/24    Authorization Type [place] [employer] [place]    Authorization Time Period no auth    Progress Note Due on Visit 10    PT Start Time 0806    PT Stop Time 0844    PT Time Calculation (min) 38 min    Activity Tolerance Patient tolerated treatment well;Patient limited by pain;No increased pain    Behavior During Therapy [facility] for tasks assessed/performed          History reviewed. No pertinent past medical history. History reviewed. No pertinent surgical history. There are no active problems to display for this patient.   PCP: No PCP  REFERRING PROVIDER: Margrette Taft BRAVO, MD  REFERRING DIAG: M51.34 (ICD-10-CM) - Bulging of thoracic intervertebral disc  Rationale for Evaluation and Treatment: Rehabilitation  THERAPY DIAG:  Chronic left-sided thoracic back pain  Cervicalgia  Weakness of both upper extremities  ONSET DATE: 2 years ago  SUBJECTIVE:                                                                                                                                                                                           SUBJECTIVE STATEMENT: 04/14/24:  Reports of heat and achey pain between shoulder blades with radicular symptoms down to left arm finger tips anterior and posterior.    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 Pt has not has PT before. 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 Pt states that pain stays on the left side, both left arm and leg have had numbness due to the slipped disc. Pt states nothing completely takes pain away especially the nerve  sensation it just goes away on its own. Pt reports stopping gym membership about a year ago due to back pain.  PERTINENT HISTORY:  Thoracic slipped disc  PAIN:  Are you having pain? Yes: NPRS scale: 7/10 Pain location: mainly mid thoracic spine and left arm Pain description: burning, numbness/asleep sensation Aggravating factors: sleeping on stomach, avoids lifting, after work Relieving factors: lidocane patches, heat and ice cycle, advil   PRECAUTIONS: None  RED FLAGS: None   WEIGHT BEARING RESTRICTIONS: No  FALLS:  Has patient fallen in last 6 months?  No  OCCUPATION: Server at [employer]   PLOF: Independent  PATIENT GOALS: Decrease the pain frequency and intensity, get back into working out   NEXT MD VISIT: July  OBJECTIVE:  Note: Objective measures were completed at Evaluation unless otherwise noted.  DIAGNOSTIC FINDINGS:  CLINICAL DATA:  Initial evaluation for left arm and leg weakness for 2 days.   EXAM: MRI THORACIC WITHOUT AND WITH CONTRAST   TECHNIQUE: Multiplanar and multiecho pulse sequences of the thoracic spine were obtained without and with intravenous contrast.   CONTRAST:  5mL GADAVIST  GADOBUTROL  1 MMOL/ML IV SOLN   COMPARISON:  None.   FINDINGS: Alignment: Physiologic with preservation of the normal thoracic kyphosis. No listhesis.   Vertebrae: Vertebral body height maintained without acute or chronic fracture. Bone marrow signal intensity within normal limits. No discrete or worrisome osseous lesions. No abnormal marrow edema or enhancement.   Cord: Normal signal and morphology. No evidence for demyelinating disease. No abnormal enhancement.   Paraspinal and other soft tissues: Unremarkable.   Disc levels:   T1-2: Unremarkable.   T2-3: Unremarkable.   T3-4: Left paracentral disc protrusion indents the left ventral thecal sac (series 18, image 11). Secondary flattening of the left ventral cord without cord signal changes or  significant spinal stenosis. Foramina remain patent.   T4-5: Small left paracentral disc protrusion indents the ventral thecal sac (series 18, image 14). Mild flattening of the left hemi cord without cord signal changes. No significant spinal stenosis. Foramina remain patent.   T5-6: Tiny right central disc protrusion minimally indents the ventral thecal sac without significant stenosis or cord deformity. Foramina remain patent.   T6-7: Minimal disc bulge. No significant spinal stenosis. Foramina remain patent.   T7-8: Minimal left eccentric disc bulge. Mild flattening of the ventral thecal sac without significant spinal stenosis or cord deformity. Foramina remain patent.   T8-9: Mild left eccentric disc bulge. No significant spinal stenosis or cord deformity. Foramina remain patent.   T9-10: Unremarkable.   T10-11: Unremarkable.   T11-12: Unremarkable.   T12-L1: Unremarkable.   IMPRESSION: 1. Normal MRI appearance of the thoracic spinal cord. No evidence for demyelinating disease. 2. Small left paracentral disc protrusions at T3-4 and T4-5 with mild flattening of the left hemi cord, but no cord signal changes. Findings could contribute to left-sided symptoms. 3. Additional minor spondylosis at T5-6 through T8-9 without significant stenosis or impingement.  PATIENT SURVEYS:  Modified Oswestry 7/50   COGNITION: Overall cognitive status: Within functional limits for tasks assessed     SENSATION: Left shoulder always numb feeling, posterior hand a little different, palm same sensation   POSTURE: rounded shoulders, forward head, and increased thoracic kyphosis  PALPATION: Pt tender to palpation and mobilization of cervical and thoracic spinal segments. Tenderness also noted in paraspinal musculature of thoracic spine  LUMBAR/THROACIC ROM:   AROM eval  Flexion WFL, pain but not as bad as extension  Extension 50% available, pain in left arm  Right lateral flexion  WFL  Left lateral flexion WFL, Pain with OP  Right rotation No pain   Left rotation No pain   (Blank rows = not tested) CERVICAL ROM:   Active ROM A/PROM (deg) eval  Flexion pain  Extension Little pain  Right lateral flexion WFL  Left lateral flexion WFL  Right rotation WFL  Left rotation WFL   (Blank rows = not tested)  LOWER EXTREMITY ROM:     Active  Right eval Left eval  Hip flexion  Hip extension    Hip abduction    Hip adduction    Hip internal rotation    Hip external rotation    Knee flexion    Knee extension    Ankle dorsiflexion    Ankle plantarflexion    Ankle inversion    Ankle eversion     (Blank rows = not tested)   UPPER EXTREMITY MMT:  MMT Right eval Left eval  Shoulder flexion 4 4  Shoulder extension 4 4  Shoulder abduction 4 4  Shoulder adduction    Shoulder extension    Shoulder internal rotation    Shoulder external rotation    Middle trapezius 3 3  Lower trapezius 3 3  Elbow flexion    Elbow extension    Wrist flexion    Wrist extension    Wrist ulnar deviation    Wrist radial deviation    Wrist pronation    Wrist supination    Grip strength     (Blank rows = not tested)   TREATMENT DATE:  04/14/24: Reviewed goals  Educated importance of HEP  Pt able to recall Cervical retraction Scapular retraction Quadruped: thread needle  Seated: Wback ER with RTB 10x  Medial nerve glide- increased numbness Lt shoulder and ache Ulnar nerve flossing- no difference 3D thoracic excursion (flex/ext, side bend with UE overhead, rotate) 10x -pain with flexion Row with RTB 10x UT stretch 2x30  03/27/2024  Evaluation: -ROM measured, Strength assessed, HEP prescribed, pt educated on prognosis, findings, and importance of HEP compliance if given.                                                                                                                                     PATIENT EDUCATION:  Education details: Pt was  educated on findings of PT evaluation, prognosis, frequency of therapy visits and rationale, attendance policy, and HEP if given.   Person educated: Patient Education method: Explanation, Verbal cues, and Handouts Education comprehension: verbalized understanding, verbal cues required, tactile cues required, and needs further education  HOME EXERCISE PROGRAM: Access Code: [email or web address] URL: [email or web address] Date: 03/28/2024 Prepared by: Lang Ada  Exercises - Seated Scapular Retraction  - 1 x daily - 7 x weekly - 3 sets - 10 reps - Seated Cervical Retraction  - 1 x daily - 7 x weekly - 3 sets - 10 reps - Quadruped Full Range Thoracic Rotation with Reach  - 1 x daily - 7 x weekly - 3 sets - 10 reps 04/14/24: - Shoulder External Rotation and Scapular Retraction with Resistance  - 1 x daily - 7 x weekly - 3 sets - 10 reps - Standing Median Nerve Glide  - 1 x daily - 7 x weekly - 3 sets - 10 reps - Ulnar Nerve Flossing  - 1 x daily - 7 x weekly - 3 sets - 10 reps  ASSESSMENT:  CLINICAL IMPRESSION: 04/14/24:  Reviewed  goals and educated importance of HEP compliance for maximal benefits.  Pt able to recall current exercise program and demonstrate appropriate form and mechanics.  Session focus on postural educated, cervical/thoracic mobilty and periscapular muscualture strengthening.  Pt with radicular symptoms anterior and posterior Lt UE, added medial and unlar nerve glides with reports of increased tingling/numbness  proximal Lt shoulder.  EOS pt reports mid back pain resolved, does continue to have radicular symptoms to Lt finger tips.    Eval:  Patient is a 28 y.o. female who was seen today for physical therapy evaluation and treatment for M51.34 (ICD-10-CM) - Bulging of thoracic intervertebral disc.   Patient demonstrates pain in thoracic spine, cervical spine , decreased activity tolerance and abnormal posture. Decreased strength of parascapular noted as well as BUE  strength.  Patient also demonstrates increased tenderness to palpation of thoracic spine, cervical spinal segments and corresponding paraspinal musculature. Patient requires education on proper posture, posterior musculature imbalance, role of PT and importance of HEP. Patient would benefit from skilled physical therapy for decreased back pain, increased endurance with thoracic mobility, increased UE/parascapular strength, and increased pain free ROM in cervical and thoracic spinal segments for improved functional mobility, return to higher level of function with ADLs, and progress towards therapy goals.   OBJECTIVE IMPAIRMENTS: decreased activity tolerance, decreased mobility, decreased ROM, decreased strength, postural dysfunction, and pain.   ACTIVITY LIMITATIONS: carrying, lifting, bending, sleeping, and reach over head  PARTICIPATION LIMITATIONS: meal prep, cleaning, shopping, community activity, occupation, and yard work  PERSONAL FACTORS: Age, Past/current experiences, Time since onset of injury/illness/exacerbation, and 1 comorbidity: slipped disc are also affecting patient's functional outcome.   REHAB POTENTIAL: Good  CLINICAL DECISION MAKING: Stable/uncomplicated  EVALUATION COMPLEXITY: Low   GOALS: Goals reviewed with patient? No  SHORT TERM GOALS: Target date: 04/18/24  Pt will be independent with HEP in order to demonstrate participation in Physical Therapy POC.  Baseline: Goal status: INITIAL  2.  Pt will report 5/10 pain at worst with cervical/thoracic mobility in order to demonstrate improved pain with ADLs.  Baseline: see objective Goal status: INITIAL  LONG TERM GOALS: Target date: 05/09/24  Pt will demonstrate increase in parascapular strength to at least a 4/5 MMT bilaterally to improve musculature imbalance and posture for decreased contributing factor to slipped thoracic disc.  Baseline: see objective.  Goal status: INITIAL  2.  Pt will improve cervical and  thoracic ROM (flex/ext/lateral flexion/rotation) to pain free at end range in order to demonstrate improved functional mobility with out pain. Baseline: see objective. (Pain with end range and OP at eval Goal status: INITIAL  3.  Pt will improve Modified Oswestry score by at least 6 points in order to demonstrate decreased pain with functional goals and outcomes. Baseline: see objective.  Goal status: INITIAL  4.  Pt will report 3/10 pain at worst with cervical/thoracic mobility in order to demonstrate reduced pain with ADLs that require use of cervical spine musculature (driving, washing hair, reaching to elevated cabinet).  Baseline: see objective.  Goal status: INITIAL     PLAN:  PT FREQUENCY: 1-2x/week  PT DURATION: 6 weeks  PLANNED INTERVENTIONS: 97110-Therapeutic exercises, 97530- Therapeutic activity, [id number]- Neuromuscular re-education, 97535- Self Care, [id number]- Manual therapy, Patient/Family education, Joint mobilization, Spinal mobilization, Cryotherapy, and Moist heat.  PLAN FOR NEXT SESSION: progress core and thoracic/cervical mobility.  Give RTB next session. (progress as tolerated)   Augustin Mclean, LPTA/CLT; CBIS [phone]  12:15 PM, 04/14/24

## 2024-04-16 ENCOUNTER — Ambulatory Visit (HOSPITAL_COMMUNITY): Attending: Orthopedic Surgery

## 2024-04-16 DIAGNOSIS — M542 Cervicalgia: Secondary | ICD-10-CM | POA: Diagnosis present

## 2024-04-16 DIAGNOSIS — G8929 Other chronic pain: Secondary | ICD-10-CM | POA: Insufficient documentation

## 2024-04-16 DIAGNOSIS — M546 Pain in thoracic spine: Secondary | ICD-10-CM | POA: Insufficient documentation

## 2024-04-16 DIAGNOSIS — R29898 Other symptoms and signs involving the musculoskeletal system: Secondary | ICD-10-CM | POA: Insufficient documentation

## 2024-04-16 NOTE — Therapy (Signed)
 OUTPATIENT PHYSICAL THERAPY THORACOLUMBAR TREATMENT   Patient Name: Sheila Lyons MRN: [id number] DOB:10-03-1996, 28 y.o., female Today's Date: 04/16/2024  END OF SESSION:  PT End of Session - 04/16/24 0807     Visit Number 3    Number of Visits 12    Date for PT Re-Evaluation 05/09/24    Authorization Type [place] [employer]    Authorization Time Period no auth    Progress Note Due on Visit 10    PT Start Time 0806    PT Stop Time 0845    PT Time Calculation (min) 39 min    Activity Tolerance Patient tolerated treatment well;Patient limited by pain;No increased pain    Behavior During Therapy [facility] for tasks assessed/performed          No past medical history on file. No past surgical history on file. There are no active problems to display for this patient.   PCP: No PCP  REFERRING PROVIDER: Margrette Taft BRAVO, MD  REFERRING DIAG: M51.34 (ICD-10-CM) - Bulging of thoracic intervertebral disc  Rationale for Evaluation and Treatment: Rehabilitation  THERAPY DIAG:  Chronic left-sided thoracic back pain  Cervicalgia  Weakness of both upper extremities  ONSET DATE: 2 years ago  SUBJECTIVE:                                                                                                                                                                                           SUBJECTIVE STATEMENT: Having some some soreness mid back; notices especially when she lift something; mild numbness occasionally down left arm  Eval:  Pt states her back started hurting 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 Pt has not has PT before. 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 Pt states that pain stays on the left side, both left arm and leg have had numbness due to the slipped disc. Pt states nothing completely takes pain away especially the nerve sensation it just goes away on its own. Pt reports stopping gym  membership about a year ago due to back pain.  PERTINENT HISTORY:  Thoracic slipped disc  PAIN:  Are you having pain? Yes: NPRS scale: 7/10 Pain location: mainly mid thoracic spine and left arm Pain description: burning, numbness/asleep sensation Aggravating factors: sleeping on stomach, avoids lifting, after work Relieving factors: lidocane patches, heat and ice cycle, advil   PRECAUTIONS: None  RED FLAGS: None   WEIGHT BEARING RESTRICTIONS: No  FALLS:  Has patient fallen in last 6 months? No  OCCUPATION: Server at [employer]  PLOF: Independent  PATIENT GOALS: Decrease the pain frequency and intensity, get back into working out   NEXT MD VISIT: July  OBJECTIVE:  Note: Objective measures were completed at Evaluation unless otherwise noted.  DIAGNOSTIC FINDINGS:  CLINICAL DATA:  Initial evaluation for left arm and leg weakness for 2 days.   EXAM: MRI THORACIC WITHOUT AND WITH CONTRAST   TECHNIQUE: Multiplanar and multiecho pulse sequences of the thoracic spine were obtained without and with intravenous contrast.   CONTRAST:  5mL GADAVIST  GADOBUTROL  1 MMOL/ML IV SOLN   COMPARISON:  None.   FINDINGS: Alignment: Physiologic with preservation of the normal thoracic kyphosis. No listhesis.   Vertebrae: Vertebral body height maintained without acute or chronic fracture. Bone marrow signal intensity within normal limits. No discrete or worrisome osseous lesions. No abnormal marrow edema or enhancement.   Cord: Normal signal and morphology. No evidence for demyelinating disease. No abnormal enhancement.   Paraspinal and other soft tissues: Unremarkable.   Disc levels:   T1-2: Unremarkable.   T2-3: Unremarkable.   T3-4: Left paracentral disc protrusion indents the left ventral thecal sac (series 18, image 11). Secondary flattening of the left ventral cord without cord signal changes or significant spinal stenosis. Foramina remain patent.   T4-5:  Small left paracentral disc protrusion indents the ventral thecal sac (series 18, image 14). Mild flattening of the left hemi cord without cord signal changes. No significant spinal stenosis. Foramina remain patent.   T5-6: Tiny right central disc protrusion minimally indents the ventral thecal sac without significant stenosis or cord deformity. Foramina remain patent.   T6-7: Minimal disc bulge. No significant spinal stenosis. Foramina remain patent.   T7-8: Minimal left eccentric disc bulge. Mild flattening of the ventral thecal sac without significant spinal stenosis or cord deformity. Foramina remain patent.   T8-9: Mild left eccentric disc bulge. No significant spinal stenosis or cord deformity. Foramina remain patent.   T9-10: Unremarkable.   T10-11: Unremarkable.   T11-12: Unremarkable.   T12-L1: Unremarkable.   IMPRESSION: 1. Normal MRI appearance of the thoracic spinal cord. No evidence for demyelinating disease. 2. Small left paracentral disc protrusions at T3-4 and T4-5 with mild flattening of the left hemi cord, but no cord signal changes. Findings could contribute to left-sided symptoms. 3. Additional minor spondylosis at T5-6 through T8-9 without significant stenosis or impingement.  PATIENT SURVEYS:  Modified Oswestry 7/50   COGNITION: Overall cognitive status: Within functional limits for tasks assessed     SENSATION: Left shoulder always numb feeling, posterior hand a little different, palm same sensation   POSTURE: rounded shoulders, forward head, and increased thoracic kyphosis  PALPATION: Pt tender to palpation and mobilization of cervical and thoracic spinal segments. Tenderness also noted in paraspinal musculature of thoracic spine  LUMBAR/THROACIC ROM:   AROM eval  Flexion WFL, pain but not as bad as extension  Extension 50% available, pain in left arm  Right lateral flexion WFL  Left lateral flexion WFL, Pain with OP  Right rotation No  pain   Left rotation No pain   (Blank rows = not tested) CERVICAL ROM:   Active ROM A/PROM (deg) eval  Flexion pain  Extension Little pain  Right lateral flexion WFL  Left lateral flexion WFL  Right rotation WFL  Left rotation WFL   (Blank rows = not tested)  LOWER EXTREMITY ROM:     Active  Right eval Left eval  Hip flexion    Hip extension    Hip abduction  Hip adduction    Hip internal rotation    Hip external rotation    Knee flexion    Knee extension    Ankle dorsiflexion    Ankle plantarflexion    Ankle inversion    Ankle eversion     (Blank rows = not tested)   UPPER EXTREMITY MMT:  MMT Right eval Left eval  Shoulder flexion 4 4  Shoulder extension 4 4  Shoulder abduction 4 4  Shoulder adduction    Shoulder extension    Shoulder internal rotation    Shoulder external rotation    Middle trapezius 3 3  Lower trapezius 3 3  Elbow flexion    Elbow extension    Wrist flexion    Wrist extension    Wrist ulnar deviation    Wrist radial deviation    Wrist pronation    Wrist supination    Grip strength     (Blank rows = not tested)   TREATMENT DATE:  04/16/24 Discussion of proper lifting technique Supine: Moist heat to mid back x 5' in decompressed position  Decompression exercises Red theraband horizontal shoulder abduction 2 x 10 Red theraband The sash  2 x 10 each Red theraband the overhead 2 x 10 Trial of laying on 1/2 foam roll  Alternating shoulder flexion x 10 Alternating leg extension x 10 Standing: Red theraband scapular retractions 2 x 10 Red theraband Shoulder extension 2 x 10   04/14/24: Reviewed goals  Educated importance of HEP  Pt able to recall Cervical retraction Scapular retraction Quadruped: thread needle  Seated: Wback ER with RTB 10x  Medial nerve glide- increased numbness Lt shoulder and ache Ulnar nerve flossing- no difference 3D thoracic excursion (flex/ext, side bend with UE overhead, rotate) 10x -pain  with flexion Row with RTB 10x UT stretch 2x30  03/27/2024  Evaluation: -ROM measured, Strength assessed, HEP prescribed, pt educated on prognosis, findings, and importance of HEP compliance if given.                                                                                                                                     PATIENT EDUCATION:  Education details: Pt was educated on findings of PT evaluation, prognosis, frequency of therapy visits and rationale, attendance policy, and HEP if given.   Person educated: Patient Education method: Explanation, Verbal cues, and Handouts Education comprehension: verbalized understanding, verbal cues required, tactile cues required, and needs further education  HOME EXERCISE PROGRAM: 04/16/24  decompression exercises: theraband shoulder abduction, the sash Access Code: [email or web address] URL: [email or web address] Date: 03/28/2024 Prepared by: Lang Ada  Exercises - Seated Scapular Retraction  - 1 x daily - 7 x weekly - 3 sets - 10 reps - Seated Cervical Retraction  - 1 x daily - 7 x weekly - 3 sets - 10 reps - Quadruped Full Range Thoracic Rotation with Reach  - 1 x daily - 7 x weekly -  3 sets - 10 reps 04/14/24: - Shoulder External Rotation and Scapular Retraction with Resistance  - 1 x daily - 7 x weekly - 3 sets - 10 reps - Standing Median Nerve Glide  - 1 x daily - 7 x weekly - 3 sets - 10 reps - Ulnar Nerve Flossing  - 1 x daily - 7 x weekly - 3 sets - 10 reps  ASSESSMENT:  CLINICAL IMPRESSION: Today's session with focus on postural strengthening and decompression of the spine. Patient with no pain report during treatment today.  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  Issued red theraband for home use and updated HEP.  Patient will benefit from continued skilled therapy services to  address deficits and promote return to optimal function.     Eval:  Patient is a 28 y.o. female who was seen today for physical therapy evaluation and treatment for M51.34 (ICD-10-CM) - Bulging of thoracic intervertebral disc.   Patient demonstrates pain in thoracic spine, cervical spine , decreased activity tolerance and abnormal posture. Decreased strength of parascapular noted as well as BUE strength.  Patient also demonstrates increased tenderness to palpation of thoracic spine, cervical spinal segments and corresponding paraspinal musculature. Patient requires education on proper posture, posterior musculature imbalance, role of PT and importance of HEP. Patient would benefit from skilled physical therapy for decreased back pain, increased endurance with thoracic mobility, increased UE/parascapular strength, and increased pain free ROM in cervical and thoracic spinal segments for improved functional mobility, return to higher level of function with ADLs, and progress towards therapy goals.   OBJECTIVE IMPAIRMENTS: decreased activity tolerance, decreased mobility, decreased ROM, decreased strength, postural dysfunction, and pain.   ACTIVITY LIMITATIONS: carrying, lifting, bending, sleeping, and reach over head  PARTICIPATION LIMITATIONS: meal prep, cleaning, shopping, community activity, occupation, and yard work  PERSONAL FACTORS: Age, Past/current experiences, Time since onset of injury/illness/exacerbation, and 1 comorbidity: slipped disc are also affecting patient's functional outcome.   REHAB POTENTIAL: Good  CLINICAL DECISION MAKING: Stable/uncomplicated  EVALUATION COMPLEXITY: Low   GOALS: Goals reviewed with patient? No  SHORT TERM GOALS: Target date: 04/18/24  Pt will be independent with HEP in order to demonstrate participation in Physical Therapy POC.  Baseline: Goal status: INITIAL  2.  Pt will report 5/10 pain at worst with cervical/thoracic mobility in order to  demonstrate improved pain with ADLs.  Baseline: see objective Goal status: INITIAL  LONG TERM GOALS: Target date: 05/09/24  Pt will demonstrate increase in parascapular strength to at least a 4/5 MMT bilaterally to improve musculature imbalance and posture for decreased contributing factor to slipped thoracic disc.  Baseline: see objective.  Goal status: INITIAL  2.  Pt will improve cervical and thoracic ROM (flex/ext/lateral flexion/rotation) to pain free at end range in order to demonstrate improved functional mobility with out pain. Baseline: see objective. (Pain with end range and OP at eval Goal status: INITIAL  3.  Pt will improve Modified Oswestry score by at least 6 points in order to demonstrate decreased pain with functional goals and outcomes. Baseline: see objective.  Goal status: INITIAL  4.  Pt will report 3/10 pain at worst with cervical/thoracic mobility in order to demonstrate reduced pain with ADLs that require use of cervical spine musculature (driving, washing hair, reaching to elevated cabinet).  Baseline: see objective.  Goal status: INITIAL  PLAN:  PT FREQUENCY: 1-2x/week  PT DURATION: 6 weeks  PLANNED INTERVENTIONS: 97110-Therapeutic exercises, 97530- Therapeutic activity, [id number]- Neuromuscular re-education, 97535- Self Care, [id number]- Manual therapy, Patient/Family education, Joint mobilization, Spinal mobilization, Cryotherapy, and Moist heat.  PLAN FOR NEXT SESSION: progress core and thoracic/cervical mobility.   8:46 AM, 04/16/24 Marta Bouie Small Joellen Tullos MPT [place]  [phone]

## 2024-04-17 ENCOUNTER — Encounter (HOSPITAL_COMMUNITY): Admitting: Physical Therapy

## 2024-04-28 ENCOUNTER — Ambulatory Visit (HOSPITAL_COMMUNITY)

## 2024-04-28 DIAGNOSIS — M542 Cervicalgia: Secondary | ICD-10-CM

## 2024-04-28 DIAGNOSIS — M546 Pain in thoracic spine: Secondary | ICD-10-CM | POA: Diagnosis not present

## 2024-04-28 DIAGNOSIS — G8929 Other chronic pain: Secondary | ICD-10-CM

## 2024-04-28 DIAGNOSIS — R29898 Other symptoms and signs involving the musculoskeletal system: Secondary | ICD-10-CM

## 2024-04-28 NOTE — Therapy (Signed)
 OUTPATIENT PHYSICAL THERAPY THORACOLUMBAR TREATMENT   Patient Name: Sheila Lyons MRN: [id number] DOB:Jan 27, 1996, 28 y.o., female Today's Date: 04/28/2024  END OF SESSION:  PT End of Session - 04/28/24 0804     Visit Number 4    Number of Visits 12    Date for PT Re-Evaluation 05/09/24    Authorization Type [place] [employer] [place]    Authorization Time Period no auth    Progress Note Due on Visit 10    PT Start Time 0804    PT Stop Time 0844    PT Time Calculation (min) 40 min    Activity Tolerance Patient tolerated treatment well    Behavior During Therapy [facility] for tasks assessed/performed          No past medical history on file. No past surgical history on file. There are no active problems to display for this patient.   PCP: No PCP  REFERRING PROVIDER: Margrette Taft BRAVO, MD  REFERRING DIAG: M51.34 (ICD-10-CM) - Bulging of thoracic intervertebral disc  Rationale for Evaluation and Treatment: Rehabilitation  THERAPY DIAG:  Chronic left-sided thoracic back pain  Cervicalgia  Weakness of both upper extremities  ONSET DATE: 2 years ago  SUBJECTIVE:                                                                                                                                                                                           SUBJECTIVE STATEMENT: Was on vacation last week so feeling good today; not hurting.  She has to work today.    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 Pt has not has PT before. 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 Pt states that pain stays on the left side, both left arm and leg have had numbness due to the slipped disc. Pt states nothing completely takes pain away especially the nerve sensation it just goes away on its own. Pt reports stopping gym membership about a year ago due to back pain.  PERTINENT HISTORY:   Thoracic slipped disc  PAIN:  Are you having pain? Yes: NPRS scale: 7/10 Pain location: mainly mid thoracic spine and left arm Pain description: burning, numbness/asleep sensation Aggravating factors: sleeping on stomach, avoids lifting, after work Relieving factors: lidocane patches, heat and ice cycle, advil   PRECAUTIONS: None  RED FLAGS: None   WEIGHT BEARING RESTRICTIONS: No  FALLS:  Has patient fallen in last 6 months? No  OCCUPATION: Server at [employer]   PLOF: Independent  PATIENT GOALS: Decrease the pain frequency and intensity, get back into working out   NEXT MD VISIT: July  OBJECTIVE:  Note: Objective measures were completed at Evaluation unless otherwise noted.  DIAGNOSTIC FINDINGS:  CLINICAL DATA:  Initial evaluation for left arm and leg weakness for 2 days.   EXAM: MRI THORACIC WITHOUT AND WITH CONTRAST   TECHNIQUE: Multiplanar and multiecho pulse sequences of the thoracic spine were obtained without and with intravenous contrast.   CONTRAST:  5mL GADAVIST  GADOBUTROL  1 MMOL/ML IV SOLN   COMPARISON:  None.   FINDINGS: Alignment: Physiologic with preservation of the normal thoracic kyphosis. No listhesis.   Vertebrae: Vertebral body height maintained without acute or chronic fracture. Bone marrow signal intensity within normal limits. No discrete or worrisome osseous lesions. No abnormal marrow edema or enhancement.   Cord: Normal signal and morphology. No evidence for demyelinating disease. No abnormal enhancement.   Paraspinal and other soft tissues: Unremarkable.   Disc levels:   T1-2: Unremarkable.   T2-3: Unremarkable.   T3-4: Left paracentral disc protrusion indents the left ventral thecal sac (series 18, image 11). Secondary flattening of the left ventral cord without cord signal changes or significant spinal stenosis. Foramina remain patent.   T4-5: Small left paracentral disc protrusion indents the ventral thecal  sac (series 18, image 14). Mild flattening of the left hemi cord without cord signal changes. No significant spinal stenosis. Foramina remain patent.   T5-6: Tiny right central disc protrusion minimally indents the ventral thecal sac without significant stenosis or cord deformity. Foramina remain patent.   T6-7: Minimal disc bulge. No significant spinal stenosis. Foramina remain patent.   T7-8: Minimal left eccentric disc bulge. Mild flattening of the ventral thecal sac without significant spinal stenosis or cord deformity. Foramina remain patent.   T8-9: Mild left eccentric disc bulge. No significant spinal stenosis or cord deformity. Foramina remain patent.   T9-10: Unremarkable.   T10-11: Unremarkable.   T11-12: Unremarkable.   T12-L1: Unremarkable.   IMPRESSION: 1. Normal MRI appearance of the thoracic spinal cord. No evidence for demyelinating disease. 2. Small left paracentral disc protrusions at T3-4 and T4-5 with mild flattening of the left hemi cord, but no cord signal changes. Findings could contribute to left-sided symptoms. 3. Additional minor spondylosis at T5-6 through T8-9 without significant stenosis or impingement.  PATIENT SURVEYS:  Modified Oswestry 7/50   COGNITION: Overall cognitive status: Within functional limits for tasks assessed     SENSATION: Left shoulder always numb feeling, posterior hand a little different, palm same sensation   POSTURE: rounded shoulders, forward head, and increased thoracic kyphosis  PALPATION: Pt tender to palpation and mobilization of cervical and thoracic spinal segments. Tenderness also noted in paraspinal musculature of thoracic spine  LUMBAR/THROACIC ROM:   AROM eval  Flexion WFL, pain but not as bad as extension  Extension 50% available, pain in left arm  Right lateral flexion WFL  Left lateral flexion WFL, Pain with OP  Right rotation No pain   Left rotation No pain   (Blank rows = not  tested) CERVICAL ROM:   Active ROM A/PROM (deg) eval  Flexion pain  Extension Little pain  Right lateral flexion WFL  Left lateral flexion WFL  Right rotation WFL  Left rotation WFL   (Blank rows = not tested)  LOWER EXTREMITY ROM:     Active  Right eval Left eval  Hip flexion    Hip extension    Hip abduction    Hip adduction  Hip internal rotation    Hip external rotation    Knee flexion    Knee extension    Ankle dorsiflexion    Ankle plantarflexion    Ankle inversion    Ankle eversion     (Blank rows = not tested)   UPPER EXTREMITY MMT:  MMT Right eval Left eval  Shoulder flexion 4 4  Shoulder extension 4 4  Shoulder abduction 4 4  Shoulder adduction    Shoulder extension    Shoulder internal rotation    Shoulder external rotation    Middle trapezius 3 3  Lower trapezius 3 3  Elbow flexion    Elbow extension    Wrist flexion    Wrist extension    Wrist ulnar deviation    Wrist radial deviation    Wrist pronation    Wrist supination    Grip strength     (Blank rows = not tested)   TREATMENT DATE:  04/28/24 UBE x 4' backwards dynamic warm up Scapular retraction green theraband 2 x 10 Shoulder extension green theraband 2 x 10 Paloff press green theraband 2 x 10 each Seated upper trap stretch 3 x 20 Seated levator stretch 3 x 20 Lat pulls 3 plates x 10 then 4 plates 2 x 10 Standing wall angels x 10 Wall push ups x 10   04/16/24 Discussion of proper lifting technique Supine: Moist heat to mid back x 5' in decompressed position  Decompression exercises Red theraband horizontal shoulder abduction 2 x 10 Red theraband The sash  2 x 10 each Red theraband the overhead 2 x 10 Trial of laying on 1/2 foam roll  Alternating shoulder flexion x 10 Alternating leg extension x 10 Standing: Red theraband scapular retractions 2 x 10 Red theraband Shoulder extension 2 x 10   04/14/24: Reviewed goals  Educated importance of HEP  Pt able to  recall Cervical retraction Scapular retraction Quadruped: thread needle  Seated: Wback ER with RTB 10x  Medial nerve glide- increased numbness Lt shoulder and ache Ulnar nerve flossing- no difference 3D thoracic excursion (flex/ext, side bend with UE overhead, rotate) 10x -pain with flexion Row with RTB 10x UT stretch 2x30  03/27/2024  Evaluation: -ROM measured, Strength assessed, HEP prescribed, pt educated on prognosis, findings, and importance of HEP compliance if given.                                                                                                                                     PATIENT EDUCATION:  Education details: Pt was educated on findings of PT evaluation, prognosis, frequency of therapy visits and rationale, attendance policy, and HEP if given.   Person educated: Patient Education method: Explanation, Verbal cues, and Handouts Education comprehension: verbalized understanding, verbal cues required, tactile cues required, and needs further education  HOME EXERCISE PROGRAM: 04/16/24  decompression exercises: theraband shoulder abduction, the sash Access Code: [email or web address] URL: [email or web address] Date:  03/28/2024 Prepared by: Lang Ada  Exercises - Seated Scapular Retraction  - 1 x daily - 7 x weekly - 3 sets - 10 reps - Seated Cervical Retraction  - 1 x daily - 7 x weekly - 3 sets - 10 reps - Quadruped Full Range Thoracic Rotation with Reach  - 1 x daily - 7 x weekly - 3 sets - 10 reps 04/14/24: - Shoulder External Rotation and Scapular Retraction with Resistance  - 1 x daily - 7 x weekly - 3 sets - 10 reps - Standing Median Nerve Glide  - 1 x daily - 7 x weekly - 3 sets - 10 reps - Ulnar Nerve Flossing  - 1 x daily - 7 x weekly - 3 sets - 10 reps  ASSESSMENT:  CLINICAL IMPRESSION: Patient arrives with no pain today.  Started with UBE as a dynamic warm up.  Progressed to green theraband today and issued green theraband for  home use. Added lat pulls.  Patient without any report of increased pain with increased exercise intensity during treatment today.   Patient will benefit from continued skilled therapy services to address deficits and promote return to optimal function.     Eval:  Patient is a 28 y.o. female who was seen today for physical therapy evaluation and treatment for M51.34 (ICD-10-CM) - Bulging of thoracic intervertebral disc.   Patient demonstrates pain in thoracic spine, cervical spine , decreased activity tolerance and abnormal posture. Decreased strength of parascapular noted as well as BUE strength.  Patient also demonstrates increased tenderness to palpation of thoracic spine, cervical spinal segments and corresponding paraspinal musculature. Patient requires education on proper posture, posterior musculature imbalance, role of PT and importance of HEP. Patient would benefit from skilled physical therapy for decreased back pain, increased endurance with thoracic mobility, increased UE/parascapular strength, and increased pain free ROM in cervical and thoracic spinal segments for improved functional mobility, return to higher level of function with ADLs, and progress towards therapy goals.   OBJECTIVE IMPAIRMENTS: decreased activity tolerance, decreased mobility, decreased ROM, decreased strength, postural dysfunction, and pain.   ACTIVITY LIMITATIONS: carrying, lifting, bending, sleeping, and reach over head  PARTICIPATION LIMITATIONS: meal prep, cleaning, shopping, community activity, occupation, and yard work  PERSONAL FACTORS: Age, Past/current experiences, Time since onset of injury/illness/exacerbation, and 1 comorbidity: slipped disc are also affecting patient's functional outcome.   REHAB POTENTIAL: Good  CLINICAL DECISION MAKING: Stable/uncomplicated  EVALUATION COMPLEXITY: Low   GOALS: Goals reviewed with patient? No  SHORT TERM GOALS: Target date: 04/18/24  Pt will be independent  with HEP in order to demonstrate participation in Physical Therapy POC.  Baseline: Goal status: INITIAL  2.  Pt will report 5/10 pain at worst with cervical/thoracic mobility in order to demonstrate improved pain with ADLs.  Baseline: see objective Goal status: INITIAL  LONG TERM GOALS: Target date: 05/09/24  Pt will demonstrate increase in parascapular strength to at least a 4/5 MMT bilaterally to improve musculature imbalance and posture for decreased contributing factor to slipped thoracic disc.  Baseline: see objective.  Goal status: INITIAL  2.  Pt will improve cervical and thoracic ROM (flex/ext/lateral flexion/rotation) to pain free at end range in order to demonstrate improved functional mobility with out pain. Baseline: see objective. (Pain with end range and OP at eval Goal status: INITIAL  3.  Pt will improve Modified Oswestry score by at least 6 points in order to demonstrate decreased pain with functional goals and outcomes. Baseline: see objective.  Goal status: INITIAL  4.  Pt will report 3/10 pain at worst with cervical/thoracic mobility in order to demonstrate reduced pain with ADLs that require use of cervical spine musculature (driving, washing hair, reaching to elevated cabinet).  Baseline: see objective.  Goal status: INITIAL     PLAN:  PT FREQUENCY: 1-2x/week  PT DURATION: 6 weeks  PLANNED INTERVENTIONS: 97110-Therapeutic exercises, 97530- Therapeutic activity, [id number]- Neuromuscular re-education, 97535- Self Care, [id number]- Manual therapy, Patient/Family education, Joint mobilization, Spinal mobilization, Cryotherapy, and Moist heat.  PLAN FOR NEXT SESSION: progress core and thoracic/cervical mobility.   8:04 AM, 04/28/24 Lachele Lievanos Small Amalea Ottey MPT [facility] [place] [phone]

## 2024-05-01 ENCOUNTER — Ambulatory Visit (HOSPITAL_COMMUNITY)

## 2024-05-01 ENCOUNTER — Encounter (HOSPITAL_COMMUNITY): Payer: Self-pay

## 2024-05-01 DIAGNOSIS — M546 Pain in thoracic spine: Secondary | ICD-10-CM | POA: Diagnosis not present

## 2024-05-01 DIAGNOSIS — M542 Cervicalgia: Secondary | ICD-10-CM

## 2024-05-01 DIAGNOSIS — G8929 Other chronic pain: Secondary | ICD-10-CM

## 2024-05-01 DIAGNOSIS — R29898 Other symptoms and signs involving the musculoskeletal system: Secondary | ICD-10-CM

## 2024-05-01 NOTE — Therapy (Signed)
 OUTPATIENT PHYSICAL THERAPY THORACOLUMBAR TREATMENT   Patient Name: Sheila Lyons MRN: [id number] DOB:1996/10/09, 28 y.o., female Today's Date: 05/01/2024  END OF SESSION:  PT End of Session - 05/01/24 1400     Visit Number 5    Number of Visits 12    Date for PT Re-Evaluation 05/09/24    Authorization Type [place] [employer] [place]    Authorization Time Period no auth    Progress Note Due on Visit 10    PT Start Time 1400    PT Stop Time 1429    PT Time Calculation (min) 29 min    Activity Tolerance Patient tolerated treatment well    Behavior During Therapy WFL for tasks assessed/performed           History reviewed. No pertinent past medical history. History reviewed. No pertinent surgical history. There are no active problems to display for this patient.   PCP: No PCP  REFERRING PROVIDER: Margrette Taft BRAVO, MD  REFERRING DIAG: M51.34 (ICD-10-CM) - Bulging of thoracic intervertebral disc  Rationale for Evaluation and Treatment: Rehabilitation  THERAPY DIAG:  Chronic left-sided thoracic back pain  Cervicalgia  Weakness of both upper extremities  ONSET DATE: 2 years ago  SUBJECTIVE:                                                                                                                                                                                           SUBJECTIVE STATEMENT: Pt states she has not had any numbness or tingling since for about 2 weeks. No pain today. Pt reports HEP compliance and enjoys resistance band.  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 Pt has not has PT before. 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 Pt states that pain stays on the left side, both left arm and leg have had numbness due to the slipped disc. Pt states nothing completely takes pain away especially the nerve sensation it just goes away on its own. Pt  reports stopping gym membership about a year ago due to back pain.  PERTINENT HISTORY:  Thoracic slipped disc  PAIN:  Are you having pain? Yes: NPRS scale: 7/10 Pain location: mainly mid thoracic spine and left arm Pain description: burning, numbness/asleep sensation Aggravating factors: sleeping on stomach, avoids lifting, after work Relieving factors: lidocane patches, heat and ice cycle, advil   PRECAUTIONS: None  RED FLAGS: None   WEIGHT BEARING RESTRICTIONS: No  FALLS:  Has patient fallen in last 6 months? No  OCCUPATION:  Server at [employer]   PLOF: Independent  PATIENT GOALS: Decrease the pain frequency and intensity, get back into working out   NEXT MD VISIT: July  OBJECTIVE:  Note: Objective measures were completed at Evaluation unless otherwise noted.  DIAGNOSTIC FINDINGS:  CLINICAL DATA:  Initial evaluation for left arm and leg weakness for 2 days.   EXAM: MRI THORACIC WITHOUT AND WITH CONTRAST   TECHNIQUE: Multiplanar and multiecho pulse sequences of the thoracic spine were obtained without and with intravenous contrast.   CONTRAST:  5mL GADAVIST  GADOBUTROL  1 MMOL/ML IV SOLN   COMPARISON:  None.   FINDINGS: Alignment: Physiologic with preservation of the normal thoracic kyphosis. No listhesis.   Vertebrae: Vertebral body height maintained without acute or chronic fracture. Bone marrow signal intensity within normal limits. No discrete or worrisome osseous lesions. No abnormal marrow edema or enhancement.   Cord: Normal signal and morphology. No evidence for demyelinating disease. No abnormal enhancement.   Paraspinal and other soft tissues: Unremarkable.   Disc levels:   T1-2: Unremarkable.   T2-3: Unremarkable.   T3-4: Left paracentral disc protrusion indents the left ventral thecal sac (series 18, image 11). Secondary flattening of the left ventral cord without cord signal changes or significant spinal stenosis. Foramina  remain patent.   T4-5: Small left paracentral disc protrusion indents the ventral thecal sac (series 18, image 14). Mild flattening of the left hemi cord without cord signal changes. No significant spinal stenosis. Foramina remain patent.   T5-6: Tiny right central disc protrusion minimally indents the ventral thecal sac without significant stenosis or cord deformity. Foramina remain patent.   T6-7: Minimal disc bulge. No significant spinal stenosis. Foramina remain patent.   T7-8: Minimal left eccentric disc bulge. Mild flattening of the ventral thecal sac without significant spinal stenosis or cord deformity. Foramina remain patent.   T8-9: Mild left eccentric disc bulge. No significant spinal stenosis or cord deformity. Foramina remain patent.   T9-10: Unremarkable.   T10-11: Unremarkable.   T11-12: Unremarkable.   T12-L1: Unremarkable.   IMPRESSION: 1. Normal MRI appearance of the thoracic spinal cord. No evidence for demyelinating disease. 2. Small left paracentral disc protrusions at T3-4 and T4-5 with mild flattening of the left hemi cord, but no cord signal changes. Findings could contribute to left-sided symptoms. 3. Additional minor spondylosis at T5-6 through T8-9 without significant stenosis or impingement.  PATIENT SURVEYS:  Modified Oswestry 7/50   COGNITION: Overall cognitive status: Within functional limits for tasks assessed     SENSATION: Left shoulder always numb feeling, posterior hand a little different, palm same sensation   POSTURE: rounded shoulders, forward head, and increased thoracic kyphosis  PALPATION: Pt tender to palpation and mobilization of cervical and thoracic spinal segments. Tenderness also noted in paraspinal musculature of thoracic spine  LUMBAR/THROACIC ROM:   AROM eval  Flexion WFL, pain but not as bad as extension  Extension 50% available, pain in left arm  Right lateral flexion WFL  Left lateral flexion WFL, Pain  with OP  Right rotation No pain   Left rotation No pain   (Blank rows = not tested) CERVICAL ROM:   Active ROM A/PROM (deg) eval  Flexion pain  Extension Little pain  Right lateral flexion WFL  Left lateral flexion WFL  Right rotation WFL  Left rotation WFL   (Blank rows = not tested)  LOWER EXTREMITY ROM:     Active  Right eval Left eval  Hip flexion    Hip extension  Hip abduction    Hip adduction    Hip internal rotation    Hip external rotation    Knee flexion    Knee extension    Ankle dorsiflexion    Ankle plantarflexion    Ankle inversion    Ankle eversion     (Blank rows = not tested)   UPPER EXTREMITY MMT:  MMT Right eval Left eval  Shoulder flexion 4 4  Shoulder extension 4 4  Shoulder abduction 4 4  Shoulder adduction    Shoulder extension    Shoulder internal rotation    Shoulder external rotation    Middle trapezius 3 3  Lower trapezius 3 3  Elbow flexion    Elbow extension    Wrist flexion    Wrist extension    Wrist ulnar deviation    Wrist radial deviation    Wrist pronation    Wrist supination    Grip strength     (Blank rows = not tested)   TREATMENT DATE:  05/01/2024  Therapeutic Exercise: -UBE x 4' fwd/backwards dynamic warm up -Standing shoulder rows/extensions paired with chin tuck, 1 set of 10 reps, GTB on webslide -D2 flexion, 1 set of 10 reps bilaterally, GTB on webslide -Open book on wall with ball at hip, towel slides, 1 set of 10 reps bilaterally -Dumbbell upright row to shoulder press, 10 pound, 2 sets of 8 reps -Kettle bell swings, 2 sets of 10 reps, 10 pounds, pt cued for increased squat and scapular retractions -UT Stretch's, 1 set 30 second holds, bilaterally -Lat pulls 4 plates x 10 then 5 plates 2 x 10   04/28/24 UBE x 4' backwards dynamic warm up Scapular retraction green theraband 2 x 10 Shoulder extension green theraband 2 x 10 Paloff press green theraband 2 x 10 each Seated upper trap stretch 3 x  20 Seated levator stretch 3 x 20 Lat pulls 3 plates x 10 then 4 plates 2 x 10 Standing wall angels x 10 Wall push ups x 10   04/16/24 Discussion of proper lifting technique Supine: Moist heat to mid back x 5' in decompressed position  Decompression exercises Red theraband horizontal shoulder abduction 2 x 10 Red theraband The sash  2 x 10 each Red theraband the overhead 2 x 10 Trial of laying on 1/2 foam roll  Alternating shoulder flexion x 10 Alternating leg extension x 10 Standing: Red theraband scapular retractions 2 x 10 Red theraband Shoulder extension 2 x 10                                                                                                                                   PATIENT EDUCATION:  Education details: Pt was educated on findings of PT evaluation, prognosis, frequency of therapy visits and rationale, attendance policy, and HEP if given.   Person educated: Patient Education method: Explanation, Verbal cues, and Handouts Education comprehension: verbalized understanding, verbal cues  required, tactile cues required, and needs further education  HOME EXERCISE PROGRAM: 04/16/24  decompression exercises: theraband shoulder abduction, the sash Access Code: [email or web address] URL: [email or web address] Date: 03/28/2024 Prepared by: Lang Ada  Exercises - Seated Scapular Retraction  - 1 x daily - 7 x weekly - 3 sets - 10 reps - Seated Cervical Retraction  - 1 x daily - 7 x weekly - 3 sets - 10 reps - Quadruped Full Range Thoracic Rotation with Reach  - 1 x daily - 7 x weekly - 3 sets - 10 reps 04/14/24: - Shoulder External Rotation and Scapular Retraction with Resistance  - 1 x daily - 7 x weekly - 3 sets - 10 reps - Standing Median Nerve Glide  - 1 x daily - 7 x weekly - 3 sets - 10 reps - Ulnar Nerve Flossing  - 1 x daily - 7 x weekly - 3 sets - 10 reps  ASSESSMENT:  CLINICAL IMPRESSION: Patient demonstrates improved pain free cervical spine  ROM, decreased pain in neck and mid back, and increased  functional mobility in bilateral upper trapezius and paraspinals of cervical spine. Pt able to progress scapular mobility and strength to parascapular mm. Patient requires moderate verbal cuing for proper form of new movements. Patient would benefit from skilled physical therapy for decreased pain in neck and mid back, decreased tension in bilateral UT and increased strength in paraspinal and other postural musculature, and improved posture for improved ability to work without symptom reproduction, return to higher level of function with ADLs, and progress towards therapy goals.    Eval:  Patient is a 28 y.o. female who was seen today for physical therapy evaluation and treatment for M51.34 (ICD-10-CM) - Bulging of thoracic intervertebral disc. Patient demonstrates pain in thoracic spine, cervical spine , decreased activity tolerance and abnormal posture. Decreased strength of parascapular noted as well as BUE strength.  Patient also demonstrates increased tenderness to palpation of thoracic spine, cervical spinal segments and corresponding paraspinal musculature. Patient requires education on proper posture, posterior musculature imbalance, role of PT and importance of HEP. Patient would benefit from skilled physical therapy for decreased back pain, increased endurance with thoracic mobility, increased UE/parascapular strength, and increased pain free ROM in cervical and thoracic spinal segments for improved functional mobility, return to higher level of function with ADLs, and progress towards therapy goals.   OBJECTIVE IMPAIRMENTS: decreased activity tolerance, decreased mobility, decreased ROM, decreased strength, postural dysfunction, and pain.   ACTIVITY LIMITATIONS: carrying, lifting, bending, sleeping, and reach over head  PARTICIPATION LIMITATIONS: meal prep, cleaning, shopping, community activity, occupation, and yard work  PERSONAL  FACTORS: Age, Past/current experiences, Time since onset of injury/illness/exacerbation, and 1 comorbidity: slipped disc are also affecting patient's functional outcome.   REHAB POTENTIAL: Good  CLINICAL DECISION MAKING: Stable/uncomplicated  EVALUATION COMPLEXITY: Low   GOALS: Goals reviewed with patient? No  SHORT TERM GOALS: Target date: 04/18/24  Pt will be independent with HEP in order to demonstrate participation in Physical Therapy POC.  Baseline: Goal status: INITIAL  2.  Pt will report 5/10 pain at worst with cervical/thoracic mobility in order to demonstrate improved pain with ADLs.  Baseline: see objective Goal status: INITIAL  LONG TERM GOALS: Target date: 05/09/24  Pt will demonstrate increase in parascapular strength to at least a 4/5 MMT bilaterally to improve musculature imbalance and posture for decreased contributing factor to slipped thoracic disc.  Baseline: see objective.  Goal status: INITIAL  2.  Pt will improve cervical and  thoracic ROM (flex/ext/lateral flexion/rotation) to pain free at end range in order to demonstrate improved functional mobility with out pain. Baseline: see objective. (Pain with end range and OP at eval Goal status: INITIAL  3.  Pt will improve Modified Oswestry score by at least 6 points in order to demonstrate decreased pain with functional goals and outcomes. Baseline: see objective.  Goal status: INITIAL  4.  Pt will report 3/10 pain at worst with cervical/thoracic mobility in order to demonstrate reduced pain with ADLs that require use of cervical spine musculature (driving, washing hair, reaching to elevated cabinet).  Baseline: see objective.  Goal status: INITIAL     PLAN:  PT FREQUENCY: 1-2x/week  PT DURATION: 6 weeks  PLANNED INTERVENTIONS: 97110-Therapeutic exercises, 97530- Therapeutic activity, [id number]- Neuromuscular re-education, 97535- Self Care, [id number]- Manual therapy, Patient/Family education, Joint  mobilization, Spinal mobilization, Cryotherapy, and Moist heat.  PLAN FOR NEXT SESSION: progress core and thoracic/cervical mobility.  Reassess next week.   Kirat Mezquita, PT, DPT [facility] Office: [phone] 2:35 PM, 05/01/24

## 2024-05-05 ENCOUNTER — Ambulatory Visit (HOSPITAL_COMMUNITY): Admitting: Physical Therapy

## 2024-05-05 DIAGNOSIS — G8929 Other chronic pain: Secondary | ICD-10-CM

## 2024-05-05 DIAGNOSIS — R29898 Other symptoms and signs involving the musculoskeletal system: Secondary | ICD-10-CM

## 2024-05-05 DIAGNOSIS — M546 Pain in thoracic spine: Secondary | ICD-10-CM | POA: Diagnosis not present

## 2024-05-05 DIAGNOSIS — M542 Cervicalgia: Secondary | ICD-10-CM

## 2024-05-05 NOTE — Therapy (Signed)
 OUTPATIENT PHYSICAL THERAPY THORACOLUMBAR TREATMENT   Patient Name: Sheila Lyons MRN: [id number] DOB:[birth year], 28 y.o., female Today's Date: 05/05/2024  END OF SESSION:  PT End of Session - 05/05/24 0812     Visit Number 6    Number of Visits 12    Date for PT Re-Evaluation 05/09/24    Authorization Type [place] [employer] [place]    Authorization Time Period no auth    Progress Note Due on Visit 10    PT Start Time 0804    PT Stop Time 0845    PT Time Calculation (min) 41 min    Activity Tolerance Patient tolerated treatment well    Behavior During Therapy [facility] for tasks assessed/performed           No past medical history on file. No past surgical history on file. There are no active problems to display for this patient.   PCP: No PCP  REFERRING PROVIDER: Margrette Taft BRAVO, MD  REFERRING DIAG: M51.34 (ICD-10-CM) - Bulging of thoracic intervertebral disc  Rationale for Evaluation and Treatment: Rehabilitation  THERAPY DIAG:  Chronic left-sided thoracic back pain  Cervicalgia  Weakness of both upper extremities  ONSET DATE: 2 years ago  SUBJECTIVE:                                                                                                                                                                                           SUBJECTIVE STATEMENT: Pt states she has overall improved with no numbness or tingling in Lt UE since for about 2 weeks ago.  Going back to MD Friday and has PT appt before that. 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 Currently 2-3/10  Eval:  Pt states her back started hurting 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 Pt has not has PT before. 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 Pt states that pain stays on the left side, both left arm and leg have had numbness due to the slipped disc. Pt states nothing completely takes pain away especially  the nerve sensation it just goes away on its own. Pt reports stopping gym membership about a year ago due to back pain.  PERTINENT HISTORY:  Thoracic slipped disc  PAIN:  Are you having pain? Yes: NPRS scale: 7/10 Pain location: mainly mid thoracic spine and left arm Pain description: burning, numbness/asleep sensation Aggravating factors: sleeping on stomach, avoids lifting, after work Relieving factors: lidocane patches, heat and ice cycle, advil   PRECAUTIONS: None  RED FLAGS: None  WEIGHT BEARING RESTRICTIONS: No  FALLS:  Has patient fallen in last 6 months? No  OCCUPATION: Server at [employer]   PLOF: Independent  PATIENT GOALS: Decrease the pain frequency and intensity, get back into working out   NEXT MD VISIT: July  OBJECTIVE:  Note: Objective measures were completed at Evaluation unless otherwise noted.  DIAGNOSTIC FINDINGS:  CLINICAL DATA:  Initial evaluation for left arm and leg weakness for 2 days.   EXAM: MRI THORACIC WITHOUT AND WITH CONTRAST   TECHNIQUE: Multiplanar and multiecho pulse sequences of the thoracic spine were obtained without and with intravenous contrast.   CONTRAST:  5mL GADAVIST  GADOBUTROL  1 MMOL/ML IV SOLN   COMPARISON:  None.   FINDINGS: Alignment: Physiologic with preservation of the normal thoracic kyphosis. No listhesis.   Vertebrae: Vertebral body height maintained without acute or chronic fracture. Bone marrow signal intensity within normal limits. No discrete or worrisome osseous lesions. No abnormal marrow edema or enhancement.   Cord: Normal signal and morphology. No evidence for demyelinating disease. No abnormal enhancement.   Paraspinal and other soft tissues: Unremarkable.   Disc levels:   T1-2: Unremarkable.   T2-3: Unremarkable.   T3-4: Left paracentral disc protrusion indents the left ventral thecal sac (series 18, image 11). Secondary flattening of the left ventral cord without cord signal  changes or significant spinal stenosis. Foramina remain patent.   T4-5: Small left paracentral disc protrusion indents the ventral thecal sac (series 18, image 14). Mild flattening of the left hemi cord without cord signal changes. No significant spinal stenosis. Foramina remain patent.   T5-6: Tiny right central disc protrusion minimally indents the ventral thecal sac without significant stenosis or cord deformity. Foramina remain patent.   T6-7: Minimal disc bulge. No significant spinal stenosis. Foramina remain patent.   T7-8: Minimal left eccentric disc bulge. Mild flattening of the ventral thecal sac without significant spinal stenosis or cord deformity. Foramina remain patent.   T8-9: Mild left eccentric disc bulge. No significant spinal stenosis or cord deformity. Foramina remain patent.   T9-10: Unremarkable.   T10-11: Unremarkable.   T11-12: Unremarkable.   T12-L1: Unremarkable.   IMPRESSION: 1. Normal MRI appearance of the thoracic spinal cord. No evidence for demyelinating disease. 2. Small left paracentral disc protrusions at T3-4 and T4-5 with mild flattening of the left hemi cord, but no cord signal changes. Findings could contribute to left-sided symptoms. 3. Additional minor spondylosis at T5-6 through T8-9 without significant stenosis or impingement.  PATIENT SURVEYS:  Modified Oswestry 7/50   COGNITION: Overall cognitive status: Within functional limits for tasks assessed     SENSATION: Left shoulder always numb feeling, posterior hand a little different, palm same sensation   POSTURE: rounded shoulders, forward head, and increased thoracic kyphosis  PALPATION: Pt tender to palpation and mobilization of cervical and thoracic spinal segments. Tenderness also noted in paraspinal musculature of thoracic spine  LUMBAR/THROACIC ROM:   AROM eval  Flexion WFL, pain but not as bad as extension  Extension 50% available, pain in left arm  Right  lateral flexion WFL  Left lateral flexion WFL, Pain with OP  Right rotation No pain   Left rotation No pain   (Blank rows = not tested) CERVICAL ROM:   Active ROM A/PROM (deg) eval  Flexion pain  Extension Little pain  Right lateral flexion WFL  Left lateral flexion WFL  Right rotation WFL  Left rotation WFL   (Blank rows = not tested)  LOWER EXTREMITY ROM:  Active  Right eval Left eval  Hip flexion    Hip extension    Hip abduction    Hip adduction    Hip internal rotation    Hip external rotation    Knee flexion    Knee extension    Ankle dorsiflexion    Ankle plantarflexion    Ankle inversion    Ankle eversion     (Blank rows = not tested)   UPPER EXTREMITY MMT:  MMT Right eval Left eval  Shoulder flexion 4 4  Shoulder extension 4 4  Shoulder abduction 4 4  Shoulder adduction    Shoulder extension    Shoulder internal rotation    Shoulder external rotation    Middle trapezius 3 3  Lower trapezius 3 3  Elbow flexion    Elbow extension    Wrist flexion    Wrist extension    Wrist ulnar deviation    Wrist radial deviation    Wrist pronation    Wrist supination    Grip strength     (Blank rows = not tested)   TREATMENT DATE:  05/05/24 UBE 2/2 fwd and bkd, level 1 Standing:  Pallof BTB 2X10 each direction  Rows with chin tuck 2X10 BTB  Extension with chin tuck 2X10 BTB   D2 flexion BTB, single strand  2X10 each Doorway chest stretch 3X30 Bodymaster frame hold midback stretch 3X30 KB swings 10# 2X10 with squat/scap retr Bodycraft lat pull down pulleys 4PL 2x10  Chest press 2PL 2X10  Rows 3PL 2X10  05/01/2024  Therapeutic Exercise: -UBE x 4' fwd/backwards dynamic warm up -Standing shoulder rows/extensions paired with chin tuck, 1 set of 10 reps, GTB on webslide -D2 flexion, 1 set of 10 reps bilaterally, GTB on webslide -Open book on wall with ball at hip, towel slides, 1 set of 10 reps bilaterally -Dumbbell upright row to shoulder  press, 10 pound, 2 sets of 8 reps -Kettle bell swings, 2 sets of 10 reps, 10 pounds, pt cued for increased squat and scapular retractions -UT Stretch's, 1 set 30 second holds, bilaterally -Lat pulls 4 plates x 10 then 5 plates 2 x 10   04/28/24 UBE x 4' backwards dynamic warm up Scapular retraction green theraband 2 x 10 Shoulder extension green theraband 2 x 10 Paloff press green theraband 2 x 10 each Seated upper trap stretch 3 x 20 Seated levator stretch 3 x 20 Lat pulls 3 plates x 10 then 4 plates 2 x 10 Standing wall angels x 10 Wall push ups x 10   04/16/24 Discussion of proper lifting technique Supine: Moist heat to mid back x 5' in decompressed position  Decompression exercises Red theraband horizontal shoulder abduction 2 x 10 Red theraband The sash  2 x 10 each Red theraband the overhead 2 x 10 Trial of laying on 1/2 foam roll  Alternating shoulder flexion x 10 Alternating leg extension x 10 Standing: Red theraband scapular retractions 2 x 10 Red theraband Shoulder extension 2 x 10  PATIENT EDUCATION:  Education details: Pt was educated on findings of PT evaluation, prognosis, frequency of therapy visits and rationale, attendance policy, and HEP if given.   Person educated: Patient Education method: Explanation, Verbal cues, and Handouts Education comprehension: verbalized understanding, verbal cues required, tactile cues required, and needs further education  HOME EXERCISE PROGRAM: 04/16/24  decompression exercises: theraband shoulder abduction, the sash Access Code: [email or web address] URL: [email or web address] Date: 03/28/2024 Prepared by: Lang Ada  Exercises - Seated Scapular Retraction  - 1 x daily - 7 x weekly - 3 sets - 10 reps - Seated Cervical Retraction  - 1 x daily - 7 x weekly - 3 sets - 10 reps - Quadruped  Full Range Thoracic Rotation with Reach  - 1 x daily - 7 x weekly - 3 sets - 10 reps 04/14/24: - Shoulder External Rotation and Scapular Retraction with Resistance  - 1 x daily - 7 x weekly - 3 sets - 10 reps - Standing Median Nerve Glide  - 1 x daily - 7 x weekly - 3 sets - 10 reps - Ulnar Nerve Flossing  - 1 x daily - 7 x weekly - 3 sets - 10 reps  ASSESSMENT:  CLINICAL IMPRESSION: Continued with focus on improving mid back, postural strength and stability.  Added bodycraft rows/chest press with noted chest weakness.  Doorway stretch added to open up tight mm and also midback stretch in standing with bodyweight resistance.  Increased resistance to blue band today with all other challenges without good challenge. Pt able to complete all exercises today without c/o pain or issues.   Generally good form with only minor cues needed. Pt will continue to benefit from skilled therapy to progress towards goals.     Eval:  Patient is a 28 y.o. female who was seen today for physical therapy evaluation and treatment for M51.34 (ICD-10-CM) - Bulging of thoracic intervertebral disc. Patient demonstrates pain in thoracic spine, cervical spine , decreased activity tolerance and abnormal posture. Decreased strength of parascapular noted as well as BUE strength.  Patient also demonstrates increased tenderness to palpation of thoracic spine, cervical spinal segments and corresponding paraspinal musculature. Patient requires education on proper posture, posterior musculature imbalance, role of PT and importance of HEP. Patient would benefit from skilled physical therapy for decreased back pain, increased endurance with thoracic mobility, increased UE/parascapular strength, and increased pain free ROM in cervical and thoracic spinal segments for improved functional mobility, return to higher level of function with ADLs, and progress towards therapy goals.   OBJECTIVE IMPAIRMENTS: decreased activity tolerance, decreased  mobility, decreased ROM, decreased strength, postural dysfunction, and pain.   ACTIVITY LIMITATIONS: carrying, lifting, bending, sleeping, and reach over head  PARTICIPATION LIMITATIONS: meal prep, cleaning, shopping, community activity, occupation, and yard work  PERSONAL FACTORS: Age, Past/current experiences, Time since onset of injury/illness/exacerbation, and 1 comorbidity: slipped disc are also affecting patient's functional outcome.   REHAB POTENTIAL: Good  CLINICAL DECISION MAKING: Stable/uncomplicated  EVALUATION COMPLEXITY: Low   GOALS: Goals reviewed with patient? No  SHORT TERM GOALS: Target date: 04/18/24  Pt will be independent with HEP in order to demonstrate participation in Physical Therapy POC.  Baseline: Goal status: INITIAL  2.  Pt will report 5/10 pain at worst with cervical/thoracic mobility in order to demonstrate improved pain with ADLs.  Baseline: see objective Goal status: INITIAL  LONG TERM GOALS: Target date: 05/09/24  Pt will demonstrate increase in parascapular strength to at least a 4/5 MMT bilaterally to improve musculature  imbalance and posture for decreased contributing factor to slipped thoracic disc.  Baseline: see objective.  Goal status: INITIAL  2.  Pt will improve cervical and thoracic ROM (flex/ext/lateral flexion/rotation) to pain free at end range in order to demonstrate improved functional mobility with out pain. Baseline: see objective. (Pain with end range and OP at eval Goal status: INITIAL  3.  Pt will improve Modified Oswestry score by at least 6 points in order to demonstrate decreased pain with functional goals and outcomes. Baseline: see objective.  Goal status: INITIAL  4.  Pt will report 3/10 pain at worst with cervical/thoracic mobility in order to demonstrate reduced pain with ADLs that require use of cervical spine musculature (driving, washing hair, reaching to elevated cabinet).  Baseline: see objective.  Goal  status: INITIAL     PLAN:  PT FREQUENCY: 1-2x/week  PT DURATION: 6 weeks  PLANNED INTERVENTIONS: 97110-Therapeutic exercises, 97530- Therapeutic activity, [id number]- Neuromuscular re-education, 97535- Self Care, [id number]- Manual therapy, Patient/Family education, Joint mobilization, Spinal mobilization, Cryotherapy, and Moist heat.  PLAN FOR NEXT SESSION: progress core and thoracic/cervical mobility.  Reassess next visit.   Greig KATHEE Fuse, PTA/CLT [facility] Ph: [phone] 8:48 AM, 05/05/24

## 2024-05-09 ENCOUNTER — Ambulatory Visit (INDEPENDENT_AMBULATORY_CARE_PROVIDER_SITE_OTHER): Admitting: Orthopedic Surgery

## 2024-05-09 ENCOUNTER — Ambulatory Visit (HOSPITAL_COMMUNITY)

## 2024-05-09 DIAGNOSIS — M5134 Other intervertebral disc degeneration, thoracic region: Secondary | ICD-10-CM

## 2024-05-09 DIAGNOSIS — M546 Pain in thoracic spine: Secondary | ICD-10-CM | POA: Diagnosis not present

## 2024-05-09 DIAGNOSIS — R29898 Other symptoms and signs involving the musculoskeletal system: Secondary | ICD-10-CM

## 2024-05-09 DIAGNOSIS — M542 Cervicalgia: Secondary | ICD-10-CM

## 2024-05-09 DIAGNOSIS — G8929 Other chronic pain: Secondary | ICD-10-CM

## 2024-05-09 NOTE — Progress Notes (Signed)
   Follow-up visit  Encounter Diagnosis  Name Primary?   Bulging of thoracic intervertebral disc Yes    Recheck after PT  [place] was discharged after physical therapy today with significant improvement in overall symptoms.  No back pain no numbness and tingling in the arm.  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  Encouraged to do the home exercise program follow-up as needed

## 2024-05-09 NOTE — Therapy (Signed)
 OUTPATIENT PHYSICAL THERAPY THORACOLUMBAR TREATMENT/PROGRESS NOTE Progress Note Reporting Period 03/28/24 to 05/09/24  See note below for Objective Data and Assessment of Progress/Goals.   PHYSICAL THERAPY DISCHARGE SUMMARY  Visits from Start of Care: 8  Current functional level related to goals / functional outcomes: See below   Remaining deficits: See below   Education / Equipment: HEP   Patient agrees to discharge. Patient goals were met. Patient is being discharged due to meeting the stated rehab goals.       Patient Name: Sheila Lyons MRN: [id number] DOB:June 17, 1996, 28 y.o., female Today's Date: 05/09/2024  END OF SESSION:  PT End of Session - 05/09/24 0852     Visit Number 8    Number of Visits 12    Date for PT Re-Evaluation 05/09/24    Authorization Type [place] [employer] [place]    Authorization Time Period no auth    Progress Note Due on Visit 10    PT Start Time 0847    PT Stop Time 0927    PT Time Calculation (min) 40 min    Activity Tolerance Patient tolerated treatment well    Behavior During Therapy [facility] for tasks assessed/performed           No past medical history on file. No past surgical history on file. There are no active problems to display for this patient.   PCP: No PCP  REFERRING PROVIDER: Margrette Taft BRAVO, MD  REFERRING DIAG: M51.34 (ICD-10-CM) - Bulging of thoracic intervertebral disc  Rationale for Evaluation and Treatment: Rehabilitation  THERAPY DIAG:  Chronic left-sided thoracic back pain  Cervicalgia  Weakness of both upper extremities  ONSET DATE: 2 years ago  SUBJECTIVE:                                                                                                                                                                                           SUBJECTIVE STATEMENT: About 90% better overall; more aware of her posture at work and lifting technique  Eval:  Pt states her back  started hurting 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 Pt has not has PT before. 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 Pt states that pain stays on the left side, both left arm and leg have had numbness due to the slipped disc. Pt states nothing completely takes pain away especially the nerve sensation it just goes away on its own. Pt reports stopping gym membership about a year ago due to back pain.  PERTINENT HISTORY:  Thoracic slipped disc  PAIN:  Are you having pain? Yes: NPRS scale: 7/10 Pain location: mainly mid thoracic spine and left arm Pain description: burning, numbness/asleep sensation Aggravating factors: sleeping on stomach, avoids lifting, after work Relieving factors: lidocane patches, heat and ice cycle, advil   PRECAUTIONS: None  RED FLAGS: None   WEIGHT BEARING RESTRICTIONS: No  FALLS:  Has patient fallen in last 6 months? No  OCCUPATION: Server at [employer]   PLOF: Independent  PATIENT GOALS: Decrease the pain frequency and intensity, get back into working out   NEXT MD VISIT: July  OBJECTIVE:  Note: Objective measures were completed at Evaluation unless otherwise noted.  DIAGNOSTIC FINDINGS:  CLINICAL DATA:  Initial evaluation for left arm and leg weakness for 2 days.   EXAM: MRI THORACIC WITHOUT AND WITH CONTRAST   TECHNIQUE: Multiplanar and multiecho pulse sequences of the thoracic spine were obtained without and with intravenous contrast.   CONTRAST:  5mL GADAVIST  GADOBUTROL  1 MMOL/ML IV SOLN   COMPARISON:  None.   FINDINGS: Alignment: Physiologic with preservation of the normal thoracic kyphosis. No listhesis.   Vertebrae: Vertebral body height maintained without acute or chronic fracture. Bone marrow signal intensity within normal limits. No discrete or worrisome osseous lesions. No abnormal marrow edema or enhancement.   Cord: Normal signal and morphology. No evidence  for demyelinating disease. No abnormal enhancement.   Paraspinal and other soft tissues: Unremarkable.   Disc levels:   T1-2: Unremarkable.   T2-3: Unremarkable.   T3-4: Left paracentral disc protrusion indents the left ventral thecal sac (series 18, image 11). Secondary flattening of the left ventral cord without cord signal changes or significant spinal stenosis. Foramina remain patent.   T4-5: Small left paracentral disc protrusion indents the ventral thecal sac (series 18, image 14). Mild flattening of the left hemi cord without cord signal changes. No significant spinal stenosis. Foramina remain patent.   T5-6: Tiny right central disc protrusion minimally indents the ventral thecal sac without significant stenosis or cord deformity. Foramina remain patent.   T6-7: Minimal disc bulge. No significant spinal stenosis. Foramina remain patent.   T7-8: Minimal left eccentric disc bulge. Mild flattening of the ventral thecal sac without significant spinal stenosis or cord deformity. Foramina remain patent.   T8-9: Mild left eccentric disc bulge. No significant spinal stenosis or cord deformity. Foramina remain patent.   T9-10: Unremarkable.   T10-11: Unremarkable.   T11-12: Unremarkable.   T12-L1: Unremarkable.   IMPRESSION: 1. Normal MRI appearance of the thoracic spinal cord. No evidence for demyelinating disease. 2. Small left paracentral disc protrusions at T3-4 and T4-5 with mild flattening of the left hemi cord, but no cord signal changes. Findings could contribute to left-sided symptoms. 3. Additional minor spondylosis at T5-6 through T8-9 without significant stenosis or impingement.  PATIENT SURVEYS:  Modified Oswestry 7/50   COGNITION: Overall cognitive status: Within functional limits for tasks assessed     SENSATION: Left shoulder always numb feeling, posterior hand a little different, palm same sensation   POSTURE: rounded shoulders, forward  head, and increased thoracic kyphosis  PALPATION: Pt tender to palpation and mobilization of cervical and thoracic spinal segments. Tenderness also noted in paraspinal musculature of thoracic spine  LUMBAR/THROACIC ROM:   AROM eval  Flexion WFL, pain but not as bad as extension  Extension 50% available, pain in left arm  Right lateral flexion WFL  Left lateral flexion WFL, Pain with OP  Right rotation No pain   Left rotation No pain   (Blank  rows = not tested) CERVICAL ROM:   Active ROM A/PROM (deg) eval  Flexion pain  Extension Little pain  Right lateral flexion WFL  Left lateral flexion WFL  Right rotation WFL  Left rotation WFL   (Blank rows = not tested)  LOWER EXTREMITY ROM:     Active  Right eval Left eval  Hip flexion    Hip extension    Hip abduction    Hip adduction    Hip internal rotation    Hip external rotation    Knee flexion    Knee extension    Ankle dorsiflexion    Ankle plantarflexion    Ankle inversion    Ankle eversion     (Blank rows = not tested)   UPPER EXTREMITY MMT:  MMT Right eval Left eval Right 05/09/24 Left 05/09/24  Shoulder flexion 4 4 5 5   Shoulder extension 4 4 5 5   Shoulder abduction 4 4 5 5   Shoulder adduction      Shoulder extension      Shoulder internal rotation      Shoulder external rotation      Middle trapezius 3 3    Lower trapezius 3 3    Elbow flexion      Elbow extension      Wrist flexion      Wrist extension      Wrist ulnar deviation      Wrist radial deviation      Wrist pronation      Wrist supination      Grip strength       (Blank rows = not tested)   TREATMENT DATE:  05/09/24 Progress note Modified Oswestry 1/50 AROM of cervical, lumbar and thoracic spine all grossly wnl MMT's see above  05/05/24 UBE 2/2 fwd and bkd, level 1 Standing:  Pallof BTB 2X10 each direction  Rows with chin tuck 2X10 BTB  Extension with chin tuck 2X10 BTB   D2 flexion BTB, single strand  2X10 each Doorway  chest stretch 3X30 Bodymaster frame hold midback stretch 3X30 KB swings 10# 2X10 with squat/scap retr Bodycraft lat pull down pulleys 4PL 2x10  Chest press 2PL 2X10  Rows 3PL 2X10  05/01/2024  Therapeutic Exercise: -UBE x 4' fwd/backwards dynamic warm up -Standing shoulder rows/extensions paired with chin tuck, 1 set of 10 reps, GTB on webslide -D2 flexion, 1 set of 10 reps bilaterally, GTB on webslide -Open book on wall with ball at hip, towel slides, 1 set of 10 reps bilaterally -Dumbbell upright row to shoulder press, 10 pound, 2 sets of 8 reps -Kettle bell swings, 2 sets of 10 reps, 10 pounds, pt cued for increased squat and scapular retractions -UT Stretch's, 1 set 30 second holds, bilaterally -Lat pulls 4 plates x 10 then 5 plates 2 x 10   04/28/24 UBE x 4' backwards dynamic warm up Scapular retraction green theraband 2 x 10 Shoulder extension green theraband 2 x 10 Paloff press green theraband 2 x 10 each Seated upper trap stretch 3 x 20 Seated levator stretch 3 x 20 Lat pulls 3 plates x 10 then 4 plates 2 x 10 Standing wall angels x 10 Wall push ups x 10   04/16/24 Discussion of proper lifting technique Supine: Moist heat to mid back x 5' in decompressed position  Decompression exercises Red theraband horizontal shoulder abduction 2 x 10 Red theraband The sash  2 x 10 each Red theraband the overhead 2 x 10 Trial of laying on  1/2 foam roll  Alternating shoulder flexion x 10 Alternating leg extension x 10 Standing: Red theraband scapular retractions 2 x 10 Red theraband Shoulder extension 2 x 10                                                                                                                                   PATIENT EDUCATION:  Education details: Pt was educated on findings of PT evaluation, prognosis, frequency of therapy visits and rationale, attendance policy, and HEP if given.   Person educated: Patient Education method: Explanation,  Verbal cues, and Handouts Education comprehension: verbalized understanding, verbal cues required, tactile cues required, and needs further education  HOME EXERCISE PROGRAM: 04/16/24  decompression exercises: theraband shoulder abduction, the sash Access Code: [email or web address] URL: [email or web address] Date: 03/28/2024 Prepared by: Lang Ada  Exercises - Seated Scapular Retraction  - 1 x daily - 7 x weekly - 3 sets - 10 reps - Seated Cervical Retraction  - 1 x daily - 7 x weekly - 3 sets - 10 reps - Quadruped Full Range Thoracic Rotation with Reach  - 1 x daily - 7 x weekly - 3 sets - 10 reps 04/14/24: - Shoulder External Rotation and Scapular Retraction with Resistance  - 1 x daily - 7 x weekly - 3 sets - 10 reps - Standing Median Nerve Glide  - 1 x daily - 7 x weekly - 3 sets - 10 reps - Ulnar Nerve Flossing  - 1 x daily - 7 x weekly - 3 sets - 10 reps  ASSESSMENT:  CLINICAL IMPRESSION: Progress note.  All goals met and patient is agreeable to discharge at this time.     Eval:  Patient is a 28 y.o. female who was seen today for physical therapy evaluation and treatment for M51.34 (ICD-10-CM) - Bulging of thoracic intervertebral disc. Patient demonstrates pain in thoracic spine, cervical spine , decreased activity tolerance and abnormal posture. Decreased strength of parascapular noted as well as BUE strength.  Patient also demonstrates increased tenderness to palpation of thoracic spine, cervical spinal segments and corresponding paraspinal musculature. Patient requires education on proper posture, posterior musculature imbalance, role of PT and importance of HEP. Patient would benefit from skilled physical therapy for decreased back pain, increased endurance with thoracic mobility, increased UE/parascapular strength, and increased pain free ROM in cervical and thoracic spinal segments for improved functional mobility, return to higher level of function with ADLs, and progress  towards therapy goals.   OBJECTIVE IMPAIRMENTS: decreased activity tolerance, decreased mobility, decreased ROM, decreased strength, postural dysfunction, and pain.   ACTIVITY LIMITATIONS: carrying, lifting, bending, sleeping, and reach over head  PARTICIPATION LIMITATIONS: meal prep, cleaning, shopping, community activity, occupation, and yard work  PERSONAL FACTORS: Age, Past/current experiences, Time since onset of injury/illness/exacerbation, and 1 comorbidity: slipped disc are also affecting patient's functional outcome.   REHAB POTENTIAL: Good  CLINICAL DECISION MAKING: Stable/uncomplicated  EVALUATION COMPLEXITY: Low  GOALS: Goals reviewed with patient? No  SHORT TERM GOALS: Target date: 04/18/24  Pt will be independent with HEP in order to demonstrate participation in Physical Therapy POC.  Baseline: Goal status: MET  2.  Pt will report 5/10 pain at worst with cervical/thoracic mobility in order to demonstrate improved pain with ADLs.  Baseline: see objective Goal status: MET  LONG TERM GOALS: Target date: 05/09/24  Pt will demonstrate increase in parascapular strength to at least a 4/5 MMT bilaterally to improve musculature imbalance and posture for decreased contributing factor to slipped thoracic disc.  Baseline: see objective.  Goal status: MET  2.  Pt will improve cervical and thoracic ROM (flex/ext/lateral flexion/rotation) to pain free at end range in order to demonstrate improved functional mobility with out pain. Baseline: see objective. (Pain with end range and OP at eval Goal status: MET  3.  Pt will improve Modified Oswestry score by at least 6 points in order to demonstrate decreased pain with functional goals and outcomes. Baseline: see objective. 1/50 Goal status: MET  4.  Pt will report 3/10 pain at worst with cervical/thoracic mobility in order to demonstrate reduced pain with ADLs that require use of cervical spine musculature (driving, washing  hair, reaching to elevated cabinet).  Baseline: see objective.  Goal status: MET     PLAN:  PT FREQUENCY: 1-2x/week  PT DURATION: 6 weeks  PLANNED INTERVENTIONS: 97110-Therapeutic exercises, 97530- Therapeutic activity, [id number]- Neuromuscular re-education, 97535- Self Care, [id number]- Manual therapy, Patient/Family education, Joint mobilization, Spinal mobilization, Cryotherapy, and Moist heat.  PLAN FOR NEXT SESSION: discharge  8:53 AM, 05/09/24 Jirah Rider Small Zohar Laing MPT [facility] [place] [phone]

## 2024-05-12 ENCOUNTER — Encounter (HOSPITAL_COMMUNITY)

## 2024-05-15 ENCOUNTER — Encounter (HOSPITAL_COMMUNITY)

## 2024-07-24 ENCOUNTER — Ambulatory Visit (INDEPENDENT_AMBULATORY_CARE_PROVIDER_SITE_OTHER): Admitting: Adult Health

## 2024-07-24 ENCOUNTER — Encounter: Payer: Self-pay | Admitting: Adult Health

## 2024-07-24 ENCOUNTER — Other Ambulatory Visit (HOSPITAL_COMMUNITY)
Admission: RE | Admit: 2024-07-24 | Discharge: 2024-07-24 | Disposition: A | Source: Ambulatory Visit | Attending: Adult Health | Admitting: Adult Health

## 2024-07-24 VITALS — BP 120/81 | HR 83 | Ht 67.0 in | Wt 148.0 lb

## 2024-07-24 DIAGNOSIS — N898 Other specified noninflammatory disorders of vagina: Secondary | ICD-10-CM

## 2024-07-24 DIAGNOSIS — Z3202 Encounter for pregnancy test, result negative: Secondary | ICD-10-CM

## 2024-07-24 DIAGNOSIS — N911 Secondary amenorrhea: Secondary | ICD-10-CM | POA: Diagnosis not present

## 2024-07-24 LAB — POCT URINE PREGNANCY: Preg Test, Ur: NEGATIVE

## 2024-07-24 MED ORDER — MEDROXYPROGESTERONE ACETATE 10 MG PO TABS: 10.0000 mg | ORAL_TABLET | Freq: Every day | ORAL | 0 refills | Status: DC

## 2024-07-24 NOTE — Progress Notes (Signed)
  Subjective:     Patient ID: Sheila Lyons, female   DOB: 1996/06/22, 28 y.o.   MRN: [id number]  HPI Kristeena is a 28 year old white female,single, G0P0, in complaining of irregular periods, with no period since April, and recurrent BV/yeast just started this year.  Last pap negative 11/15/23.  Review of Systems No period since April +irregular periods since stopping depo about 4 years ago Has had recurrent BV and yeast this year +migraines with aura Reviewed past medical,surgical, social and family history. Reviewed medications and allergies.     Objective:   Physical Exam BP 120/81 (BP Location: Right Arm, Patient Position: Sitting, Cuff Size: Normal)   Pulse 83   Ht 5' 7 (1.702 m)   Wt 148 lb (67.1 kg)   LMP 02/08/2024   BMI 23.18 kg/m  UPT is negative Skin warm and dry.Pelvic: external genitalia is normal in appearance, has some razor rash, vagina: white discharge without odor,urethra has no lesions or masses noted, cervix:smooth, uterus: normal size, shape and contour, non tender, no masses felt, adnexa: no masses or tenderness noted. Bladder is non tender and no masses felt. CV swab obtained.   Fall risk is low  Upstream - 07/24/24 1123       Pregnancy Intention Screening   Does the patient want to become pregnant in the next year? No    Does the patient's partner want to become pregnant in the next year? No    Would the patient like to discuss contraceptive options today? No      Contraception Wrap Up   Current Method Female Condom    End Method Female Condom         Examination chaperoned by Clarita Salt LPN  Assessment:     1. Negative pregnancy test - POCT urine pregnancy  2. Secondary amenorrhea (Primary) No period since April UPT is negative Will rx provera 10 mg 1 daily for 10 days Meds ordered this encounter  Medications   medroxyPROGESTERone (PROVERA) 10 MG tablet    Sig: Take 1 tablet (10 mg total) by mouth daily. X 10 days    Dispense:  10  tablet    Refill:  0    Supervising Provider:   JAYNE, LUTHER H [[id number]]     3. Vaginal discharge CV swab sent for BV and yeast - Cervicovaginal ancillary only( [facility])    Try clipping instead of shaving   Plan:     Follow up 08/15/24 to see if has withdrawal bleed

## 2024-07-25 LAB — CERVICOVAGINAL ANCILLARY ONLY
Bacterial Vaginitis (gardnerella): NEGATIVE
Candida Glabrata: NEGATIVE
Candida Vaginitis: NEGATIVE
Comment: NEGATIVE
Comment: NEGATIVE
Comment: NEGATIVE

## 2024-07-28 ENCOUNTER — Ambulatory Visit: Payer: Self-pay | Admitting: Adult Health

## 2024-08-15 ENCOUNTER — Encounter: Payer: Self-pay | Admitting: Adult Health

## 2024-08-15 ENCOUNTER — Ambulatory Visit (INDEPENDENT_AMBULATORY_CARE_PROVIDER_SITE_OTHER): Admitting: Adult Health

## 2024-08-15 VITALS — BP 118/83 | HR 73 | Ht 67.0 in | Wt 149.5 lb

## 2024-08-15 DIAGNOSIS — N911 Secondary amenorrhea: Secondary | ICD-10-CM

## 2024-08-15 NOTE — Progress Notes (Signed)
  Subjective:     Patient ID: Sheila Lyons, female   DOB: 09-02-1996, 28 y.o.   MRN: [id number]  HPI Estephany is a 28 year old white female,single, G0P0, back in follow up on taking provera 10 mg daily for 10 days, had not had a period since April and she did bleed after provera.  Last pap was 11/15/23 NILM  Review of Systems Did get a periods after taking provera Reviewed past medical,surgical, social and family history. Reviewed medications and allergies.     Objective:   Physical Exam BP 118/83 (BP Location: Right Arm, Patient Position: Sitting, Cuff Size: Normal)   Pulse 73   Ht 5' 7 (1.702 m)   Wt 149 lb 8 oz (67.8 kg)   LMP 08/08/2024 (Exact Date)   BMI 23.42 kg/m     Skin warm and dry. Lungs: clear to ausculation bilaterally. Cardiovascular: regular rate and rhythm.   Upstream - 08/15/24 1126       Pregnancy Intention Screening   Does the patient want to become pregnant in the next year? No    Does the patient's partner want to become pregnant in the next year? No    Would the patient like to discuss contraceptive options today? No      Contraception Wrap Up   Current Method Female Condom    End Method Female Condom          Assessment:     1. Secondary amenorrhea (Primary) She did get a period after taking provera She is aware that will want a period every 3 months, so if no period by end of January make appt to see me Will rx provera again She does not want to cycle with BCP    Plan:     Follow up prn
